# Patient Record
Sex: Male | Born: 1945 | Race: White | Hispanic: No | Smoking: Never smoker
Health system: Southern US, Community
[De-identification: ages and names within clinical notes are randomized; demographics above are authoritative.]

## PROBLEM LIST (undated history)

## (undated) DIAGNOSIS — D649 Anemia, unspecified: Secondary | ICD-10-CM

## (undated) DIAGNOSIS — F419 Anxiety disorder, unspecified: Secondary | ICD-10-CM

## (undated) DIAGNOSIS — D696 Thrombocytopenia, unspecified: Secondary | ICD-10-CM

## (undated) DIAGNOSIS — D699 Hemorrhagic condition, unspecified: Secondary | ICD-10-CM

## (undated) DIAGNOSIS — I493 Ventricular premature depolarization: Secondary | ICD-10-CM

## (undated) DIAGNOSIS — I499 Cardiac arrhythmia, unspecified: Secondary | ICD-10-CM

## (undated) DIAGNOSIS — I10 Essential (primary) hypertension: Secondary | ICD-10-CM

## (undated) DIAGNOSIS — D369 Benign neoplasm, unspecified site: Secondary | ICD-10-CM

## (undated) DIAGNOSIS — R972 Elevated prostate specific antigen [PSA]: Secondary | ICD-10-CM

## (undated) DIAGNOSIS — Z8679 Personal history of other diseases of the circulatory system: Secondary | ICD-10-CM

## (undated) HISTORY — DX: Essential (primary) hypertension: I10

## (undated) HISTORY — DX: Hemorrhagic condition, unspecified: D69.9

## (undated) HISTORY — DX: Anxiety disorder, unspecified: F41.9

## (undated) HISTORY — PX: OTHER SURGICAL HISTORY: SHX169

## (undated) HISTORY — PX: TONSILLECTOMY: SUR1361

## (undated) HISTORY — PX: CARDIAC VALVE REPLACEMENT: SHX585

---

## 2004-12-07 ENCOUNTER — Emergency Department: Payer: Self-pay | Admitting: Emergency Medicine

## 2004-12-07 ENCOUNTER — Other Ambulatory Visit: Payer: Self-pay

## 2007-06-13 ENCOUNTER — Ambulatory Visit: Payer: Self-pay | Admitting: Unknown Physician Specialty

## 2012-06-29 ENCOUNTER — Ambulatory Visit: Payer: Self-pay | Admitting: Unknown Physician Specialty

## 2012-07-02 LAB — PATHOLOGY REPORT

## 2013-08-27 DIAGNOSIS — I493 Ventricular premature depolarization: Secondary | ICD-10-CM | POA: Insufficient documentation

## 2013-08-27 DIAGNOSIS — Q231 Congenital insufficiency of aortic valve: Secondary | ICD-10-CM | POA: Insufficient documentation

## 2014-02-03 DIAGNOSIS — I10 Essential (primary) hypertension: Secondary | ICD-10-CM | POA: Insufficient documentation

## 2014-11-04 ENCOUNTER — Ambulatory Visit: Payer: Self-pay | Admitting: Urology

## 2014-11-11 ENCOUNTER — Encounter: Payer: Self-pay | Admitting: Urology

## 2014-11-11 ENCOUNTER — Ambulatory Visit (INDEPENDENT_AMBULATORY_CARE_PROVIDER_SITE_OTHER): Payer: Commercial Managed Care - HMO | Admitting: Urology

## 2014-11-11 VITALS — BP 116/71 | HR 57 | Ht 77.0 in | Wt 217.4 lb

## 2014-11-11 DIAGNOSIS — R361 Hematospermia: Secondary | ICD-10-CM

## 2014-11-11 DIAGNOSIS — N486 Induration penis plastica: Secondary | ICD-10-CM

## 2014-11-11 LAB — URINALYSIS, COMPLETE
BILIRUBIN UA: NEGATIVE
Glucose, UA: NEGATIVE
KETONES UA: NEGATIVE
Leukocytes, UA: NEGATIVE
NITRITE UA: NEGATIVE
PROTEIN UA: NEGATIVE
RBC, UA: NEGATIVE
Specific Gravity, UA: 1.02 (ref 1.005–1.030)
Urobilinogen, Ur: 0.2 mg/dL (ref 0.2–1.0)
pH, UA: 5.5 (ref 5.0–7.5)

## 2014-11-11 LAB — MICROSCOPIC EXAMINATION
Bacteria, UA: NONE SEEN
EPITHELIAL CELLS (NON RENAL): NONE SEEN /HPF (ref 0–10)
RBC MICROSCOPIC, UA: NONE SEEN /HPF (ref 0–?)

## 2014-11-11 NOTE — Patient Instructions (Signed)
Cystoscopy       Cystoscopy is a procedure that is used to help your caregiver diagnose and sometimes treat conditions that affect your lower urinary tract. Your lower urinary tract includes your bladder and the tube through which urine passes from your bladder out of your body (urethra). Cystoscopy is performed with a thin, tube-shaped instrument (cystoscope). The cystoscope has lenses and a light at the end so that your caregiver can see inside your bladder. The cystoscope is inserted at the entrance of your urethra. Your caregiver guides it through your urethra and into your bladder. There are two main types of cystoscopy:   Flexible cystoscopy (with a flexible cystoscope).   Rigid cystoscopy (with a rigid cystoscope).  Cystoscopy may be recommended for many conditions, including:   Urinary tract infections.   Blood in your urine (hematuria).   Loss of bladder control (urinary incontinence) or overactive bladder.   Unusual cells found in a urine sample.   Urinary blockage.   Painful urination.  Cystoscopy may also be done to remove a sample of your tissue to be checked under a microscope (biopsy). It may also be done to remove or destroy bladder stones.   LET YOUR CAREGIVER KNOW ABOUT:   Allergies to food or medicine.   Medicines taken, including vitamins, herbs, eyedrops, over-the-counter medicines, and creams.   Use of steroids (by mouth or creams).   Previous problems with anesthetics or numbing medicines.   History of bleeding problems or blood clots.   Previous surgery.   Other health problems, including diabetes and kidney problems.   Possibility of pregnancy, if this applies.  PROCEDURE   The area around the opening to your urethra will be cleaned. A medicine to numb your urethra (local anesthetic) is used. If a tissue sample or stone is removed during the procedure, you may be given a medicine to make you sleep (general anesthetic).   Your caregiver will gently insert the tip of the cystoscope into your  urethra. The cystoscope will be slowly glided through your urethra and into your bladder. Sterile fluid will flow through the cystoscope and into your bladder. The fluid will expand and stretch your bladder. This gives your caregiver a better view of your bladder walls. The procedure lasts about 15-20 minutes.   AFTER THE PROCEDURE   If a local anesthetic is used, you will be allowed to go home as soon as you are ready. If a general anesthetic is used, you will be taken to a recovery area until you are stable. You may have temporary bleeding and burning on urination.   Document Released: 04/01/2000 Document Revised: 12/28/2011 Document Reviewed: 09/26/2011   ExitCare® Patient Information ©2015 [employer]. This information is not intended to replace advice given to you by your health care provider. Make sure you discuss any questions you have with your health care provider.

## 2014-11-11 NOTE — Progress Notes (Signed)
11/11/2014 11:43 AM   Dominic Osborn Oct 15, 1945 [id number]  Referring provider: Rusty Aus, MD [address] [place], [place] [address]  Chief Complaint  Patient presents with  . Hematospermia    New Patient    HPI: 69 yo M with 6 months of hematospermia referred by Dr. [name].  The suspending present with each ejaculation of varying quality ranging from light pink to dark red persistently over the past 6 months.  He ejaculates of the frequency of 1-2 times per week.  No pain with intercourse or pain with intercourse.  No history of trauma during sexual activity or masturbation.  No erectile dysfunction, able achieve and maintain an erection adequate for penetration without difficulty.    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  This has been present and stable for years. He denies any associated pain or weakness of his erections. No history of penile trauma.  Normal PSA and annual rectal exam by Dr. [name].    No gross hematuria.  No dysuria, No UTIs.  He does get up once nightly to void.    No history of kidney stones.    No currently on any blood thinners.  He does not that his father was a "free bleeder" but does report that he does have some easy bleeding tendencies.  He does not taken any ASA but does take naproxen twice daily for arthritis.  He has stopped taking this one week ago to see if this helps.    Never smoker.     PMH: Past Medical History  Diagnosis Date  . Anxiety   . Bleeding disorder   . Hypertension     Surgical History: Past Surgical History  Procedure Laterality Date  . Tonsillectomy      Home Medications:    Medication List       This list is accurate as of: 11/11/14 11:43 AM.  Always use your most recent med list.               amLODipine 5 MG tablet  Commonly known as:  NORVASC  TAKE 1 TABLET ONE TIME DAILY     atenolol 25 MG tablet    Commonly known as:  TENORMIN  Take by mouth.     Calcium-Vitamin D 600-200 MG-UNIT per tablet  Take by mouth.     cetirizine 10 MG tablet  Commonly known as:  ZYRTEC  Take by mouth.     D 2000 2000 UNITS Tabs  Generic drug:  Cholecalciferol  Take by mouth.     Magnesium 300 MG Caps  Take by mouth.     metoprolol succinate 50 MG 24 hr tablet  Commonly known as:  TOPROL-XL  TAKE 1 TABLET EVERY DAY     MULTI-VITAMINS Tabs  Take by mouth.     RA NAPROXEN SODIUM 220 MG tablet  Generic drug:  naproxen sodium  Take by mouth.        Allergies: No Known Allergies  Family History: Family History  Problem Relation Age of Onset  . Bladder Cancer Neg Hx   . Prostate cancer Neg Hx   . Kidney cancer Neg Hx     Social History:  reports that he has never smoked. He does not have any smokeless tobacco history on file. He reports that he drinks alcohol. He reports that he does not use illicit drugs.  ROS: UROLOGY Frequent Urination?: No Hard to postpone urination?: No Burning/pain with urination?: No Get up at night to urinate?: Yes Leakage of urine?: No Urine stream starts and stops?: No Trouble starting stream?: No Do you have to strain to urinate?: No Blood in urine?: No Urinary tract infection?: No Sexually transmitted disease?: No Injury to kidneys or bladder?: No Painful intercourse?: No Weak stream?: No Erection problems?: No Penile pain?: No  Gastrointestinal Nausea?: No Vomiting?: No Indigestion/heartburn?: No Diarrhea?: No Constipation?: No  Constitutional Fever: No Night sweats?: No Weight loss?: No Fatigue?: No  Skin Skin rash/lesions?: No Itching?: No  Eyes Blurred vision?: No Double vision?: No  Ears/Nose/Throat Sore throat?: No Sinus problems?: No  Hematologic/Lymphatic Swollen glands?: No Easy bruising?: No  Cardiovascular Leg swelling?: No Chest pain?: No  Respiratory Cough?: No Shortness of breath?:  No  Endocrine Excessive thirst?: No  Musculoskeletal Back pain?: No Joint pain?: No  Neurological Headaches?: No Dizziness?: No  Psychologic Depression?: No Anxiety?: No  Physical Exam: BP 116/71 mmHg  Pulse 57  Ht 6\' 5"  (1.956 m)  Wt 217 lb 6.4 oz (98.612 kg)  BMI 25.77 kg/m2  Constitutional:  Alert and oriented, No acute distress. HEENT: [place] AT, moist mucus membranes.  Trachea midline, no masses. Cardiovascular: No clubbing, cyanosis, or edema. Respiratory: Normal respiratory effort, no increased work of breathing. GI: Abdomen is soft, nontender, nondistended, no abdominal masses GU: No CVA tenderness. 30 cc prostate, rubbery lateral lobes, no masses or tenderness.  Normal scrotum with bilateral descended t.   Skin: No rashes, bruises or suspicious lesions. Lymph: No cervical or inguinal adenopathy. Neurologic: Grossly intact, no focal deficits, moving all 4 extremities. Psychiatric: Normal mood and affect.  Laboratory Data: PSA, Total (Screen) - Final result (07/25/2014 8:54 AM) PSA, Total (Screen) - Final result (07/25/2014 8:54 AM)  Component Value Range  PSA (Prostate Specific Antigen), Total 2.70 0.10-4.00 ng/mL    Comprehensive Metabolic Panel (CMP) - Final result (07/25/2014 8:54 AM) Comprehensive Metabolic Panel (CMP) - Final result (07/25/2014 8:54 AM)  Component Value Range  Glucose 91 70-110 mg/dL  Sodium 139 136-145 mmol/L  Potassium 4.1 3.6-5.1 mmol/L  Chloride 106 97-109 mmol/L  Carbon Dioxide (CO2) 30.6 22.0-32.0 mmol/L  Urea Nitrogen (BUN) 20 7-25 mg/dL  Creatinine 1.0 0.7-1.3 mg/dL   CBC w/auto Differential (5 Part) - Final result (07/25/2014 8:54 AM) CBC w/auto Differential (5 Part) - Final result (07/25/2014 8:54 AM)  Component Value Range  WBC (White Blood Cell Count) 4.5 4.1-10.2 10^3/uL  RBC (Red Blood Cell Count) 4.91 4.69-6.13 10^6/uL  Hemoglobin 14.8 14.1-18.1 gm/dL  Hematocrit 44.0 40.0-52.0 %  MCV (Mean Corpuscular Volume) 89.6  80.0-100.0 fl  MCH (Mean Corpuscular Hemoglobin) 30.1 27.0-31.2 pg  MCHC (Mean Corpuscular Hemoglobin Concentration) 33.6 32.0-36.0 gm/dL  Platelet Count 132 (L) 150-450 10^3/uL     Urinalysis Results for orders placed or performed in visit on 11/11/14  Microscopic Examination  Result Value Ref Range   WBC, UA 0-5 0 -  5 /hpf   RBC, UA None seen 0 -  2 /hpf   Epithelial Cells (non renal) None seen 0 - 10 /hpf   Bacteria, UA None seen None seen/Few  Urinalysis, Complete  Result Value Ref Range   Specific Gravity, UA 1.020 1.005 - 1.030   pH, UA 5.5 5.0 - 7.5   Color, UA Yellow Yellow   Appearance Ur Clear Clear   Leukocytes, UA Negative Negative   Protein, UA Negative Negative/Trace   Glucose, UA Negative Negative  Ketones, UA Negative Negative   RBC, UA Negative Negative   Bilirubin, UA Negative Negative   Urobilinogen, Ur 0.2 0.2 - 1.0 mg/dL   Nitrite, UA Negative Negative   Microscopic Examination See below:     Pertinent Imaging: n/a  Assessment & Plan:  69 year old male with persistent amount aspermia 6 months and asymptomatic perone's disease.  1. Hematospermia We discussed the differential diagnosis for hematospermia with underlying etiologies typically benign. Rectal exam today was normal and normal PSA. No gross hematuria or microscopic blood in his urine.  He is very anxious about this finding and would like to proceed with cystoscopy for further workup. We discussed the procedure in detail. He will return in 4 weeks. 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 Stable plaque without pain. I would not recommend any further treatment. He was advised to let us know if he develops any pain, increasing curvature, or instability of erection.  Return in about 4 weeks (around 12/09/2014) for cystoscopy (any MD).  Dominic Espy, MD  [facility] [address],  [place],  [address] [phone]

## 2014-11-12 ENCOUNTER — Encounter: Payer: Self-pay | Admitting: Urology

## 2014-11-13 ENCOUNTER — Ambulatory Visit: Payer: Self-pay

## 2014-12-12 ENCOUNTER — Other Ambulatory Visit: Payer: Commercial Managed Care - HMO

## 2017-08-30 ENCOUNTER — Ambulatory Visit
Admission: RE | Admit: 2017-08-30 | Discharge: 2017-08-30 | Disposition: A | Discharge status: Home / Self Care | Payer: Medicare HMO | Source: Ambulatory Visit | Attending: Internal Medicine | Admitting: Internal Medicine

## 2017-08-30 ENCOUNTER — Other Ambulatory Visit: Payer: Self-pay | Admitting: Internal Medicine

## 2017-08-30 DIAGNOSIS — L928 Other granulomatous disorders of the skin and subcutaneous tissue: Secondary | ICD-10-CM | POA: Diagnosis not present

## 2017-08-30 DIAGNOSIS — R079 Chest pain, unspecified: Secondary | ICD-10-CM

## 2017-08-30 DIAGNOSIS — I712 Thoracic aortic aneurysm, without rupture, unspecified: Secondary | ICD-10-CM

## 2017-08-30 DIAGNOSIS — I700 Atherosclerosis of aorta: Secondary | ICD-10-CM | POA: Insufficient documentation

## 2017-08-30 DIAGNOSIS — I2510 Atherosclerotic heart disease of native coronary artery without angina pectoris: Secondary | ICD-10-CM | POA: Diagnosis not present

## 2017-08-30 MED ORDER — IOPAMIDOL (ISOVUE-370) INJECTION 76%
75.0000 mL | Freq: Once | INTRAVENOUS | Status: AC | PRN
  Administered 2017-08-30: 75 mL via INTRAVENOUS

## 2017-08-31 ENCOUNTER — Ambulatory Visit: Payer: Self-pay

## 2017-09-25 ENCOUNTER — Ambulatory Visit (INDEPENDENT_AMBULATORY_CARE_PROVIDER_SITE_OTHER): Payer: Medicare HMO | Admitting: Vascular Surgery

## 2017-09-25 ENCOUNTER — Encounter (INDEPENDENT_AMBULATORY_CARE_PROVIDER_SITE_OTHER): Payer: Self-pay | Admitting: Vascular Surgery

## 2017-09-25 VITALS — BP 116/68 | HR 70 | Resp 14 | Ht 76.0 in | Wt 226.0 lb

## 2017-09-25 DIAGNOSIS — I712 Thoracic aortic aneurysm, without rupture: Secondary | ICD-10-CM | POA: Diagnosis not present

## 2017-09-25 DIAGNOSIS — Q231 Congenital insufficiency of aortic valve: Secondary | ICD-10-CM | POA: Diagnosis not present

## 2017-09-25 DIAGNOSIS — I10 Essential (primary) hypertension: Secondary | ICD-10-CM

## 2017-09-25 DIAGNOSIS — I7121 Aneurysm of the ascending aorta, without rupture: Secondary | ICD-10-CM

## 2017-09-25 NOTE — Progress Notes (Signed)
MRN : [id number]  Dominic Osborn is a 72 y.o. (17-Feb-1946) male who presents with chief complaint of  Chief Complaint  Patient presents with  . New Patient (Initial Visit)    Thorasic aortic aneurysm  .  History of Present Illness:   The patient presents to the office for evaluation of an ascending aortic aneurysm. The aneurysm was found incidentally by echocardiogram for aortic valve stenosis. Patient denies chest pain or unusual back pain, no other thoracic complaints.  No history of an acute onset of painful blue discoloration of the toes or TIA symptoms.     No family history of TAA.   Patient denies amaurosis fugax or TIA symptoms. There is no history of claudication or rest pain symptoms of the lower extremities.  The patient denies angina or shortness of breath.  CT scan shows an TAA that measures 5.20 cm  Current Meds  Medication Sig  . amLODipine (NORVASC) 5 MG tablet TAKE 1 TABLET ONE TIME DAILY  . amoxicillin (AMOXIL) 500 MG tablet Take 500 mg by mouth 2 (two) times daily.  Marland Kitchen atenolol (TENORMIN) 25 MG tablet Take by mouth.   . Calcium Carbonate-Vitamin D (CALCIUM HIGH POTENCY/VITAMIN D) 600-200 MG-UNIT TABS Take by mouth.  . cetirizine (ZYRTEC) 10 MG tablet Take by mouth.  . Cholecalciferol (D 2000) 2000 UNITS TABS Take by mouth.  . fluticasone (FLONASE) 50 MCG/ACT nasal spray Place into the nose.  . Magnesium 300 MG CAPS Take by mouth.  . metoprolol succinate (TOPROL-XL) 50 MG 24 hr tablet TAKE 1 TABLET EVERY DAY  . Multiple Vitamin (MULTI-VITAMINS) TABS Take by mouth.  . naproxen sodium (RA NAPROXEN SODIUM) 220 MG tablet Take by mouth.    Past Medical History:  Diagnosis Date  . Anxiety   . Bleeding disorder ([place])   . Hypertension     Past Surgical History:  Procedure Laterality Date  . TONSILLECTOMY      Social History Social History   Tobacco Use  . Smoking status: Never Smoker  . Smokeless tobacco: Never Used  Substance Use Topics  . Alcohol use:  Yes    Alcohol/week: 0.0 oz    Comment: occasional  . Drug use: No    Family History Family History  Problem Relation Age of Onset  . Bladder Cancer Neg Hx   . Prostate cancer Neg Hx   . Kidney cancer Neg Hx   No family history of bleeding/clotting disorders, porphyria or autoimmune disease   No Known Allergies   REVIEW OF SYSTEMS (Negative unless checked)  Constitutional: [] Weight loss  [] Fever  [] Chills Cardiac: [] Chest pain   [] Chest pressure   [] Palpitations   [] Shortness of breath when laying flat   [] Shortness of breath with exertion. Vascular:  [] Pain in legs with walking   [] Pain in legs at rest  [] History of DVT   [] Phlebitis   [] Swelling in legs   [] Varicose veins   [] Non-healing ulcers Pulmonary:   [] Uses home oxygen   [] Productive cough   [] Hemoptysis   [] Wheeze  [] COPD   [] Asthma Neurologic:  [] Dizziness   [] Seizures   [] History of stroke   [] History of TIA  [] Aphasia   [] Vissual changes   [] Weakness or numbness in arm   [] Weakness or numbness in leg Musculoskeletal:   [] Joint swelling   [] Joint pain   [] Low back pain Hematologic:  [] Easy bruising  [] Easy bleeding   [] Hypercoagulable state   [] Anemic Gastrointestinal:  [] Diarrhea   [] Vomiting  [] Gastroesophageal reflux/heartburn   []   Difficulty swallowing. Genitourinary:  [] Chronic kidney disease   [] Difficult urination  [] Frequent urination   [] Blood in urine Skin:  [] Rashes   [] Ulcers  Psychological:  [] History of anxiety   []  History of major depression.  Physical Examination  Vitals:   09/25/17 1558  BP: 116/68  Pulse: 70  Resp: 14  Weight: 226 lb (102.5 kg)  Height: 6\' 4"  (1.93 m)   Body mass index is 27.51 kg/m. Gen: WD/WN, NAD Head: [place]/AT, No temporalis wasting.  Ear/Nose/Throat: Hearing grossly intact, nares w/o erythema or drainage, poor dentition Eyes: PER, EOMI, sclera nonicteric.  Neck: Supple, no masses.  No bruit or JVD.  Pulmonary:  Good air movement, clear to auscultation bilaterally, no  use of accessory muscles.  Cardiac: RRR, normal S1, S2, + SE Murmurs. Vascular:  Soft carotid bruit but likely radiation from heart murmur Vessel Right Left  Radial Palpable Palpable  Ulnar Palpable Palpable  Brachial Palpable Palpable  Carotid Palpable Palpable  PT Palpable Palpable  DP Palpable Palpable   Gastrointestinal: soft, non-distended. No guarding/no peritoneal signs.  Musculoskeletal: M/S 5/5 throughout.  No deformity or atrophy.  Neurologic: CN 2-12 intact. Pain and light touch intact in extremities.  Symmetrical.  Speech is fluent. Motor exam as listed above. Psychiatric: Judgment intact, Mood & affect appropriate for pt's clinical situation. Dermatologic: No rashes or ulcers noted.  No changes consistent with cellulitis. Lymph : No Cervical lymphadenopathy, no lichenification or skin changes of chronic lymphedema.  CBC No results found for: WBC, HGB, HCT, MCV, PLT  BMET No results found for: NA, K, CL, CO2, GLUCOSE, BUN, CREATININE, CALCIUM, GFRNONAA, GFRAA CrCl cannot be calculated (No order found.).  COAG No results found for: INR, PROTIME  Radiology Ct Angio Chest Aorta W/cm &/or Wo/cm  Result Date: 08/30/2017 CLINICAL DATA:  Chest pain EXAM: CT ANGIOGRAPHY CHEST WITH CONTRAST TECHNIQUE: Initially, axial CT images were obtained through the chest without intravenous contrast material administration. Multidetector CT imaging of the chest was performed using the standard protocol during bolus administration of intravenous contrast. Multiplanar CT image reconstructions and MIPs were obtained to evaluate the vascular anatomy. CONTRAST:  63mL ISOVUE-370 IOPAMIDOL (ISOVUE-370) INJECTION 76% COMPARISON:  Chest radiograph December 07, 2004 FINDINGS: Cardiovascular: The ascending thoracic aorta measures 5.1 x 5.1 cm in transverse diameter. No dissection is appreciable on this study. No intramural hematoma is evident on the noncontrast enhanced portion of the study. The visualized  great vessels appear unremarkable. No aneurysm or dissection involving these vessels is evident on this study. There is aortic atherosclerosis as well as foci of coronary artery calcification. There is no pericardial effusion or pericardial thickening. There is no major vessel pulmonary embolus appreciable. Mediastinum/Nodes: Thyroid appears unremarkable. There is no appreciable thoracic adenopathy. No esophageal lesions are appreciable. Lungs/Pleura: There is no parenchymal lung edema or consolidation. No pleural effusion or pleural thickening is evident. There is slight scarring in the anterior left base. There is a small calcified granuloma in the posterior segment of the left upper lobe. There is a tiny calcified granuloma in the anterior segment right upper lobe. There is a tiny calcified granuloma in the superior lingula peripherally. No noncalcified pulmonary nodular lesions are demonstrable. Upper Abdomen: There is a cyst in the right lobe of the liver near the junctions of the anterior and posterior segments measuring 2.5 x 2.4 cm. Visualized upper abdominal structures otherwise appear unremarkable. Musculoskeletal: There is degenerative change in the thoracic spine with diffuse idiopathic skeletal hyperostosis. No blastic or lytic bone  lesions evident. No chest wall lesions. Review of the MIP images confirms the above findings. IMPRESSION: 1. Ascending thoracic aortic aneurysm with maximum transverse diameter of 5.1 x 5.1 cm. No aneurysm seen in the descending thoracic aorta. No evident dissection. No evident major vessel pulmonary embolus. Visualized great vessels appear unremarkable. There are foci of aortic atherosclerosis as well as foci of coronary calcification. No pulmonary embolus evident. Ascending thoracic aortic aneurysm. Recommend semi-annual imaging followup by CTA or MRA and referral to cardiothoracic surgery if not already obtained. This recommendation follows 2010  ACCF/AHA/AATS/ACR/ASA/SCA/SCAI/SIR/STS/SVM Guidelines for the Diagnosis and Management of Patients With Thoracic Aortic Disease. Circulation. 2010; 121: G295-M841. 2. Scattered small calcified granulomas. No edema or consolidation. 3.  No evident thoracic adenopathy. Aortic aneurysm NOS (ICD10-I71.9). Aortic Atherosclerosis (ICD10-I70.0). Electronically Signed   By: Lowella Grip III M.D.   On: 08/30/2017 11:24     Assessment/Plan 1. Ascending aortic aneurysm ([place]) I will refer to [facility] for surgical evaluation   A total of 70 minutes was spent with this patient and greater than 50% was spent in counseling and coordination of care with the patient.  Discussion included the treatment options for vascular disease including indications for surgery and intervention.  Also discussed is the appropriate timing of treatment.  In addition medical therapy was discussed.  - Ambulatory referral to Cardiovascular Surgery  2. Aortic valve, bicuspid Defer to cardiothoracic surgery for workup  3. Essential (primary) hypertension Continue antihypertensive medications as already ordered, these medications have been reviewed and there are no changes at this time.     Hortencia Pilar, MD  09/25/2017 5:58 PM

## 2017-09-26 ENCOUNTER — Encounter (INDEPENDENT_AMBULATORY_CARE_PROVIDER_SITE_OTHER): Payer: Self-pay | Admitting: Vascular Surgery

## 2017-09-29 ENCOUNTER — Telehealth (INDEPENDENT_AMBULATORY_CARE_PROVIDER_SITE_OTHER): Payer: Self-pay | Admitting: Vascular Surgery

## 2017-11-01 ENCOUNTER — Ambulatory Visit: Admit: 2017-11-01 | Payer: Self-pay | Admitting: Unknown Physician Specialty

## 2017-11-01 SURGERY — COLONOSCOPY WITH PROPOFOL
Anesthesia: General

## 2018-03-23 DIAGNOSIS — Z9889 Other specified postprocedural states: Secondary | ICD-10-CM | POA: Insufficient documentation

## 2018-03-23 DIAGNOSIS — Z952 Presence of prosthetic heart valve: Secondary | ICD-10-CM | POA: Insufficient documentation

## 2018-04-06 DIAGNOSIS — Z8679 Personal history of other diseases of the circulatory system: Secondary | ICD-10-CM | POA: Insufficient documentation

## 2018-05-17 ENCOUNTER — Encounter: Payer: Self-pay | Admitting: *Deleted

## 2018-05-17 ENCOUNTER — Encounter: Payer: Medicare HMO | Attending: Internal Medicine | Admitting: *Deleted

## 2018-05-17 VITALS — Ht 75.75 in | Wt 215.5 lb

## 2018-05-17 DIAGNOSIS — I10 Essential (primary) hypertension: Secondary | ICD-10-CM | POA: Insufficient documentation

## 2018-05-17 DIAGNOSIS — Z79899 Other long term (current) drug therapy: Secondary | ICD-10-CM | POA: Insufficient documentation

## 2018-05-17 DIAGNOSIS — F419 Anxiety disorder, unspecified: Secondary | ICD-10-CM | POA: Diagnosis not present

## 2018-05-17 DIAGNOSIS — Z952 Presence of prosthetic heart valve: Secondary | ICD-10-CM | POA: Diagnosis present

## 2018-05-17 NOTE — Progress Notes (Signed)
Daily Session Note  Patient Details  Name: Dominic Osborn MRN: [id number] Date of Birth: Jul 17, 1945 Referring Provider:     Cardiac Rehab from 05/17/2018 in [facility]  Referring Provider  [name]      Encounter Date: 05/17/2018  Check In: Session Check In - 05/17/18 1351      Check-In   Supervising physician immediately available to respond to emergencies  See telemetry face sheet for immediately available ER MD    Location  [facility]    Staff Present  Renita Papa, RN Vickki Hearing, BA, ACSM CEP, Exercise Physiologist    Warm-up and Cool-down  Not performed (comment)   Med Review completed   Resistance Training Performed  Yes    VAD Patient?  No    PAD/SET Patient?  No      Pain Assessment   Currently in Pain?  No/denies        Exercise Prescription Changes - 05/17/18 1400      Response to Exercise   Blood Pressure (Admit)  142/56    Blood Pressure (Exercise)  218/70    Blood Pressure (Exit)  178/68    Heart Rate (Admit)  64 bpm    Heart Rate (Exercise)  99 bpm    Heart Rate (Exit)  81 bpm    Oxygen Saturation (Admit)  98 %       Social History   Tobacco Use  Smoking Status Never Smoker  Smokeless Tobacco Never Used    Goals Met:  Exercise tolerated well Personal goals reviewed No report of cardiac concerns or symptoms Strength training completed today  Goals Unmet:  Not Applicable  Comments: Med Review completed   Dr. Emily Filbert is Medical Director for [facility].

## 2018-05-17 NOTE — Patient Instructions (Signed)
Patient Instructions  Patient Details  Name: Dominic Osborn MRN: [id number] Date of Birth: [birth year] Referring Provider:  Rusty Aus, MD  Below are your personal goals for exercise, nutrition, and risk factors. Our goal is to help you stay on track towards obtaining and maintaining these goals. We will be discussing your progress on these goals with you throughout the program.  Initial Exercise Prescription: Initial Exercise Prescription - 05/17/18 1400      Date of Initial Exercise RX and Referring Provider   Date  05/17/18    Referring Provider  [name]      Treadmill   MPH  3.6    Grade  1.5    Minutes  15    METs  4.5      Recumbant Bike   Level  8    RPM  60    Watts  85    Minutes  15    METs  4.5      Arm Ergometer   Level  3    RPM  40    Minutes  15    METs  4.5      Prescription Details   Frequency (times per week)  3    Duration  Progress to 30 minutes of continuous aerobic without signs/symptoms of physical distress      Intensity   THRR 40-80% of Max Heartrate  102-133    Ratings of Perceived Exertion  11-15    Perceived Dyspnea  0-4      Resistance Training   Training Prescription  Yes    Weight  4    Reps  10-15       Exercise Goals: Frequency: Be able to perform aerobic exercise two to three times per week in program working toward 2-5 days per week of home exercise.  Intensity: Work with a perceived exertion of 11 (fairly light) - 15 (hard) while following your exercise prescription.  We will make changes to your prescription with you as you progress through the program.   Duration: Be able to do 30 to 45 minutes of continuous aerobic exercise in addition to a 5 minute warm-up and a 5 minute cool-down routine.   Nutrition Goals: Your personal nutrition goals will be established when you do your nutrition analysis with the dietician.  The following are general nutrition guidelines to follow: Cholesterol < 200mg /day Sodium <  1500mg /day Fiber: Men over 50 yrs - 30 grams per day  Personal Goals: Personal Goals and Risk Factors at Admission - 05/17/18 1409      Core Components/Risk Factors/Patient Goals on Admission    Weight Management  Yes;Weight Maintenance    Intervention  Weight Management: Develop a combined nutrition and exercise program designed to reach desired caloric intake, while maintaining appropriate intake of nutrient and fiber, sodium and fats, and appropriate energy expenditure required for the weight goal.;Weight Management: Provide education and appropriate resources to help participant work on and attain dietary goals.    Admit Weight  215 lb 8 oz (97.8 kg)    Expected Outcomes  Short Term: Continue to assess and modify interventions until short term weight is achieved;Long Term: Adherence to nutrition and physical activity/exercise program aimed toward attainment of established weight goal;Weight Maintenance: Understanding of the daily nutrition guidelines, which includes 25-35% calories from fat, 7% or less cal from saturated fats, less than 200mg  cholesterol, less than 1.5gm of sodium, & 5 or more servings of fruits and vegetables daily;Understanding recommendations for meals to  include 15-35% energy as protein, 25-35% energy from fat, 35-60% energy from carbohydrates, less than 200mg  of dietary cholesterol, 20-35 gm of total fiber daily;Understanding of distribution of calorie intake throughout the day with the consumption of 4-5 meals/snacks    Hypertension  Yes    Intervention  Provide education on lifestyle modifcations including regular physical activity/exercise, weight management, moderate sodium restriction and increased consumption of fresh fruit, vegetables, and low fat dairy, alcohol moderation, and smoking cessation.;Monitor prescription use compliance.    Expected Outcomes  Short Term: Continued assessment and intervention until BP is < 140/5mm HG in hypertensive participants. < 130/59mm  HG in hypertensive participants with diabetes, heart failure or chronic kidney disease.;Long Term: Maintenance of blood pressure at goal levels.       Tobacco Use Initial Evaluation: Social History   Tobacco Use  Smoking Status Never Smoker  Smokeless Tobacco Never Used    Exercise Goals and Review: Exercise Goals    Row Name 05/17/18 1444             Exercise Goals   Increase Physical Activity  Yes       Intervention  Provide advice, education, support and counseling about physical activity/exercise needs.;Develop an individualized exercise prescription for aerobic and resistive training based on initial evaluation findings, risk stratification, comorbidities and participant's personal goals.       Expected Outcomes  Short Term: Attend rehab on a regular basis to increase amount of physical activity.;Long Term: Add in home exercise to make exercise part of routine and to increase amount of physical activity.;Long Term: Exercising regularly at least 3-5 days a week.       Increase Strength and Stamina  Yes       Intervention  Provide advice, education, support and counseling about physical activity/exercise needs.;Develop an individualized exercise prescription for aerobic and resistive training based on initial evaluation findings, risk stratification, comorbidities and participant's personal goals.       Expected Outcomes  Short Term: Increase workloads from initial exercise prescription for resistance, speed, and METs.;Short Term: Perform resistance training exercises routinely during rehab and add in resistance training at home;Long Term: Improve cardiorespiratory fitness, muscular endurance and strength as measured by increased METs and functional capacity (6MWT)       Able to understand and use rate of perceived exertion (RPE) scale  Yes       Intervention  Provide education and explanation on how to use RPE scale       Expected Outcomes  Short Term: Able to use RPE daily in rehab  to express subjective intensity level;Long Term:  Able to use RPE to guide intensity level when exercising independently       Able to understand and use Dyspnea scale  Yes       Intervention  Provide education and explanation on how to use Dyspnea scale       Expected Outcomes  Short Term: Able to use Dyspnea scale daily in rehab to express subjective sense of shortness of breath during exertion;Long Term: Able to use Dyspnea scale to guide intensity level when exercising independently       Knowledge and understanding of Target Heart Rate Range (THRR)  Yes       Intervention  Provide education and explanation of THRR including how the numbers were predicted and where they are located for reference       Expected Outcomes  Short Term: Able to state/look up THRR;Short Term: Able to use daily as guideline  for intensity in rehab;Long Term: Able to use THRR to govern intensity when exercising independently       Able to check pulse independently  Yes       Intervention  Review the importance of being able to check your own pulse for safety during independent exercise;Provide education and demonstration on how to check pulse in carotid and radial arteries.       Expected Outcomes  Short Term: Able to explain why pulse checking is important during independent exercise;Long Term: Able to check pulse independently and accurately       Understanding of Exercise Prescription  Yes       Intervention  Provide education, explanation, and written materials on patient's individual exercise prescription       Expected Outcomes  Short Term: Able to explain program exercise prescription;Long Term: Able to explain home exercise prescription to exercise independently          Copy of goals given to participant.

## 2018-05-17 NOTE — Progress Notes (Signed)
Cardiac Individual Treatment Plan  Patient Details  Name: Dominic Osborn MRN: [id number] Date of Birth: July 03, 1945 Referring Provider:     Cardiac Rehab from 05/17/2018 in [facility] Cardiac and Pulmonary Rehab  Referring Provider  [name]      Initial Encounter Date:    Cardiac Rehab from 05/17/2018 in [facility] Cardiac and Pulmonary Rehab  Date  05/17/18      Visit Diagnosis: S/P AVR (aortic valve replacement)  Patient's Home Medications on Admission:  Current Outpatient Medications:  .  amiodarone (PACERONE) 200 MG tablet, , Disp: , Rfl:  .  amoxicillin (AMOXIL) 500 MG tablet, Take 500 mg by mouth 2 (two) times daily., Disp: , Rfl:  .  Cholecalciferol (D 2000) 2000 UNITS TABS, Take by mouth., Disp: , Rfl:  .  fluticasone (FLONASE) 50 MCG/ACT nasal spray, Place into the nose., Disp: , Rfl:  .  furosemide (LASIX) 40 MG tablet, TK 1 T PO ONCE D, Disp: , Rfl:  .  metoprolol succinate (TOPROL-XL) 50 MG 24 hr tablet, TAKE 1 TABLET EVERY DAY, Disp: , Rfl:  .  Multiple Vitamin (MULTI-VITAMINS) TABS, Take by mouth., Disp: , Rfl:  .  potassium chloride SA (K-DUR,KLOR-CON) 20 MEQ tablet, TK 1 T PO ONCE D. TK WHILE ON LASIX, Disp: , Rfl:  .  traZODone (DESYREL) 50 MG tablet, , Disp: , Rfl:  .  amLODipine (NORVASC) 5 MG tablet, TAKE 1 TABLET ONE TIME DAILY, Disp: , Rfl:  .  atenolol (TENORMIN) 25 MG tablet, Take by mouth. , Disp: , Rfl:  .  Calcium Carbonate-Vitamin D (CALCIUM HIGH POTENCY/VITAMIN D) 600-200 MG-UNIT TABS, Take by mouth., Disp: , Rfl:  .  cetirizine (ZYRTEC) 10 MG tablet, Take by mouth., Disp: , Rfl:  .  Magnesium 300 MG CAPS, Take by mouth., Disp: , Rfl:  .  naproxen sodium (RA NAPROXEN SODIUM) 220 MG tablet, Take by mouth., Disp: , Rfl:   Past Medical History: Past Medical History:  Diagnosis Date  . Anxiety   . Bleeding disorder ([place])   . Hypertension     Tobacco Use: Social History   Tobacco Use  Smoking Status Never Smoker  Smokeless Tobacco Never Used    Labs: Recent  Review Flowsheet Data    There is no flowsheet data to display.       Exercise Target Goals: Exercise Program Goal: Individual exercise prescription set using results from initial 6 min walk test and THRR while considering  patient's activity barriers and safety.   Exercise Prescription Goal: Initial exercise prescription builds to 30-45 minutes a day of aerobic activity, 2-3 days per week.  Home exercise guidelines will be given to patient during program as part of exercise prescription that the participant will acknowledge.  Activity Barriers & Risk Stratification: Activity Barriers & Cardiac Risk Stratification - 05/17/18 1435      Activity Barriers & Cardiac Risk Stratification   Activity Barriers  None    Cardiac Risk Stratification  High       6 Minute Walk: 6 Minute Walk    Row Name 05/17/18 1445         6 Minute Walk   Phase  Initial     Distance  1900 feet     Walk Time  6 minutes     # of Rest Breaks  0     MPH  3.6     METS  4.72     RPE  13     Perceived Dyspnea  0     VO2 Peak  16.5     Symptoms  No     Resting HR  70 bpm     Resting BP  142/56     Resting Oxygen Saturation   98 %     Max Ex. HR  99 bpm     Max Ex. BP  218/70     2 Minute Post BP  178/68        Oxygen Initial Assessment:   Oxygen Re-Evaluation:   Oxygen Discharge (Final Oxygen Re-Evaluation):   Initial Exercise Prescription: Initial Exercise Prescription - 05/17/18 1400      Date of Initial Exercise RX and Referring Provider   Date  05/17/18    Referring Provider  [name]      Treadmill   MPH  3.6    Grade  1.5    Minutes  15    METs  4.5      Recumbant Bike   Level  8    RPM  60    Watts  85    Minutes  15    METs  4.5      Arm Ergometer   Level  3    RPM  40    Minutes  15    METs  4.5      Prescription Details   Frequency (times per week)  3    Duration  Progress to 30 minutes of continuous aerobic without signs/symptoms of physical distress       Intensity   THRR 40-80% of Max Heartrate  102-133    Ratings of Perceived Exertion  11-15    Perceived Dyspnea  0-4      Resistance Training   Training Prescription  Yes    Weight  4    Reps  10-15       Perform Capillary Blood Glucose checks as needed.  Exercise Prescription Changes: Exercise Prescription Changes    Row Name 05/17/18 1400             Response to Exercise   Blood Pressure (Admit)  142/56       Blood Pressure (Exercise)  218/70       Blood Pressure (Exit)  178/68       Heart Rate (Admit)  64 bpm       Heart Rate (Exercise)  99 bpm       Heart Rate (Exit)  81 bpm       Oxygen Saturation (Admit)  98 %          Exercise Comments:   Exercise Goals and Review: Exercise Goals    Row Name 05/17/18 1444             Exercise Goals   Increase Physical Activity  Yes       Intervention  Provide advice, education, support and counseling about physical activity/exercise needs.;Develop an individualized exercise prescription for aerobic and resistive training based on initial evaluation findings, risk stratification, comorbidities and participant's personal goals.       Expected Outcomes  Short Term: Attend rehab on a regular basis to increase amount of physical activity.;Long Term: Add in home exercise to make exercise part of routine and to increase amount of physical activity.;Long Term: Exercising regularly at least 3-5 days a week.       Increase Strength and Stamina  Yes       Intervention  Provide advice, education, support and counseling about physical activity/exercise needs.;Develop an  individualized exercise prescription for aerobic and resistive training based on initial evaluation findings, risk stratification, comorbidities and participant's personal goals.       Expected Outcomes  Short Term: Increase workloads from initial exercise prescription for resistance, speed, and METs.;Short Term: Perform resistance training exercises routinely during rehab  and add in resistance training at home;Long Term: Improve cardiorespiratory fitness, muscular endurance and strength as measured by increased METs and functional capacity (6MWT)       Able to understand and use rate of perceived exertion (RPE) scale  Yes       Intervention  Provide education and explanation on how to use RPE scale       Expected Outcomes  Short Term: Able to use RPE daily in rehab to express subjective intensity level;Long Term:  Able to use RPE to guide intensity level when exercising independently       Able to understand and use Dyspnea scale  Yes       Intervention  Provide education and explanation on how to use Dyspnea scale       Expected Outcomes  Short Term: Able to use Dyspnea scale daily in rehab to express subjective sense of shortness of breath during exertion;Long Term: Able to use Dyspnea scale to guide intensity level when exercising independently       Knowledge and understanding of Target Heart Rate Range (THRR)  Yes       Intervention  Provide education and explanation of THRR including how the numbers were predicted and where they are located for reference       Expected Outcomes  Short Term: Able to state/look up THRR;Short Term: Able to use daily as guideline for intensity in rehab;Long Term: Able to use THRR to govern intensity when exercising independently       Able to check pulse independently  Yes       Intervention  Review the importance of being able to check your own pulse for safety during independent exercise;Provide education and demonstration on how to check pulse in carotid and radial arteries.       Expected Outcomes  Short Term: Able to explain why pulse checking is important during independent exercise;Long Term: Able to check pulse independently and accurately       Understanding of Exercise Prescription  Yes       Intervention  Provide education, explanation, and written materials on patient's individual exercise prescription       Expected  Outcomes  Short Term: Able to explain program exercise prescription;Long Term: Able to explain home exercise prescription to exercise independently          Exercise Goals Re-Evaluation :   Discharge Exercise Prescription (Final Exercise Prescription Changes): Exercise Prescription Changes - 05/17/18 1400      Response to Exercise   Blood Pressure (Admit)  142/56    Blood Pressure (Exercise)  218/70    Blood Pressure (Exit)  178/68    Heart Rate (Admit)  64 bpm    Heart Rate (Exercise)  99 bpm    Heart Rate (Exit)  81 bpm    Oxygen Saturation (Admit)  98 %       Nutrition:  Target Goals: Understanding of nutrition guidelines, daily intake of sodium <1512m, cholesterol <2039m calories 30% from fat and 7% or less from saturated fats, daily to have 5 or more servings of fruits and vegetables.  Biometrics: Pre Biometrics - 05/17/18 1443      Pre Biometrics   Height  6' 3.75" (1.924 m)    Weight  215 lb 8 oz (97.8 kg)    Waist Circumference  39.5 inches    Hip Circumference  44 inches    Waist to Hip Ratio  0.9 %    BMI (Calculated)  26.41    Single Leg Stand  30 seconds        Nutrition Therapy Plan and Nutrition Goals: Nutrition Therapy & Goals - 05/17/18 1419      Intervention Plan   Intervention  Prescribe, educate and counsel regarding individualized specific dietary modifications aiming towards targeted core components such as weight, hypertension, lipid management, diabetes, heart failure and other comorbidities.;Nutrition handout(s) given to patient.    Expected Outcomes  Short Term Goal: Understand basic principles of dietary content, such as calories, fat, sodium, cholesterol and nutrients.;Short Term Goal: A plan has been developed with personal nutrition goals set during dietitian appointment.;Long Term Goal: Adherence to prescribed nutrition plan.       Nutrition Assessments: Nutrition Assessments - 05/17/18 1419      MEDFICTS Scores   Pre Score  74        Nutrition Goals Re-Evaluation:   Nutrition Goals Discharge (Final Nutrition Goals Re-Evaluation):   Psychosocial: Target Goals: Acknowledge presence or absence of significant depression and/or stress, maximize coping skills, provide positive support system. Participant is able to verbalize types and ability to use techniques and skills needed for reducing stress and depression.   Initial Review & Psychosocial Screening: Initial Psych Review & Screening - 05/17/18 1412      Initial Review   Current issues with  Current Sleep Concerns;Current Stress Concerns    Source of Stress Concerns  Family    Comments  Wife has a neurological disease ("cousin to MS") and COPD. She is also experiencing some memory impairment. He reports this being very difficult on him, as his wife's health is his priority. He did schedule this survery of his AV replacement around his yard duties around the house. He is very organized and enjoys staying physically active. His wife's health is hard for him because he doesn't know what all he can do to help her.       Family Dynamics   Good Support System?  Yes   spouse, family, friends     Barriers   Psychosocial barriers to participate in program  There are no identifiable barriers or psychosocial needs.;The patient should benefit from training in stress management and relaxation.      Screening Interventions   Interventions  Encouraged to exercise;Program counselor consult;To provide support and resources with identified psychosocial needs;Provide feedback about the scores to participant    Expected Outcomes  Short Term goal: Utilizing psychosocial counselor, staff and physician to assist with identification of specific Stressors or current issues interfering with healing process. Setting desired goal for each stressor or current issue identified.;Long Term Goal: Stressors or current issues are controlled or eliminated.;Short Term goal: Identification and review  with participant of any Quality of Life or Depression concerns found by scoring the questionnaire.;Long Term goal: The participant improves quality of Life and PHQ9 Scores as seen by post scores and/or verbalization of changes       Quality of Life Scores:  Quality of Life - 05/17/18 1415      Quality of Life   Select  Quality of Life      Quality of Life Scores   Health/Function Pre  27.37 %    Socioeconomic Pre  25.71 %  Psych/Spiritual Pre  28.93 %    Family Pre  27.6 %    GLOBAL Pre  27.38 %      Scores of 19 and below usually indicate a poorer quality of life in these areas.  A difference of  2-3 points is a clinically meaningful difference.  A difference of 2-3 points in the total score of the Quality of Life Index has been associated with significant improvement in overall quality of life, self-image, physical symptoms, and general health in studies assessing change in quality of life.  PHQ-9: Recent Review Flowsheet Data    Depression screen [facility] 2/9 05/17/2018   Decreased Interest 0   Down, Depressed, Hopeless 0   PHQ - 2 Score 0   Altered sleeping 0   Tired, decreased energy 0   Change in appetite 0   Feeling bad or failure about yourself  0   Trouble concentrating 0   Moving slowly or fidgety/restless 0   Suicidal thoughts 0   PHQ-9 Score 0     Interpretation of Total Score  Total Score Depression Severity:  1-4 = Minimal depression, 5-9 = Mild depression, 10-14 = Moderate depression, 15-19 = Moderately severe depression, 20-27 = Severe depression   Psychosocial Evaluation and Intervention:   Psychosocial Re-Evaluation:   Psychosocial Discharge (Final Psychosocial Re-Evaluation):   Vocational Rehabilitation: Provide vocational rehab assistance to qualifying candidates.   Vocational Rehab Evaluation & Intervention: Vocational Rehab - 05/17/18 1412      Initial Vocational Rehab Evaluation & Intervention   Assessment shows need for Vocational  Rehabilitation  No       Education: Education Goals: Education classes will be provided on a variety of topics geared toward better understanding of heart health and risk factor modification. Participant will state understanding/return demonstration of topics presented as noted by education test scores.  Learning Barriers/Preferences: Learning Barriers/Preferences - 05/17/18 1411      Learning Barriers/Preferences   Learning Barriers  None    Learning Preferences  None       Education Topics:  AED/CPR: - Group verbal and written instruction with the use of models to demonstrate the basic use of the AED with the basic ABC's of resuscitation.   General Nutrition Guidelines/Fats and Fiber: -Group instruction provided by verbal, written material, models and posters to present the general guidelines for heart healthy nutrition. Gives an explanation and review of dietary fats and fiber.   Controlling Sodium/Reading Food Labels: -Group verbal and written material supporting the discussion of sodium use in heart healthy nutrition. Review and explanation with models, verbal and written materials for utilization of the food label.   Exercise Physiology & General Exercise Guidelines: - Group verbal and written instruction with models to review the exercise physiology of the cardiovascular system and associated critical values. Provides general exercise guidelines with specific guidelines to those with heart or lung disease.    Aerobic Exercise & Resistance Training: - Gives group verbal and written instruction on the various components of exercise. Focuses on aerobic and resistive training programs and the benefits of this training and how to safely progress through these programs..   Flexibility, Balance, Mind/Body Relaxation: Provides group verbal/written instruction on the benefits of flexibility and balance training, including mind/body exercise modes such as yoga, pilates and tai  chi.  Demonstration and skill practice provided.   Stress and Anxiety: - Provides group verbal and written instruction about the health risks of elevated stress and causes of high stress.  Discuss the correlation  between heart/lung disease and anxiety and treatment options. Review healthy ways to manage with stress and anxiety.   Depression: - Provides group verbal and written instruction on the correlation between heart/lung disease and depressed mood, treatment options, and the stigmas associated with seeking treatment.   Anatomy & Physiology of the Heart: - Group verbal and written instruction and models provide basic cardiac anatomy and physiology, with the coronary electrical and arterial systems. Review of Valvular disease and Heart Failure   Cardiac Procedures: - Group verbal and written instruction to review commonly prescribed medications for heart disease. Reviews the medication, class of the drug, and side effects. Includes the steps to properly store meds and maintain the prescription regimen. (beta blockers and nitrates)   Cardiac Medications I: - Group verbal and written instruction to review commonly prescribed medications for heart disease. Reviews the medication, class of the drug, and side effects. Includes the steps to properly store meds and maintain the prescription regimen.   Cardiac Medications II: -Group verbal and written instruction to review commonly prescribed medications for heart disease. Reviews the medication, class of the drug, and side effects. (all other drug classes)    Go Sex-Intimacy & Heart Disease, Get SMART - Goal Setting: - Group verbal and written instruction through game format to discuss heart disease and the return to sexual intimacy. Provides group verbal and written material to discuss and apply goal setting through the application of the S.M.A.R.T. Method.   Other Matters of the Heart: - Provides group verbal, written materials and  models to describe Stable Angina and Peripheral Artery. Includes description of the disease process and treatment options available to the cardiac patient.   Exercise & Equipment Safety: - Individual verbal instruction and demonstration of equipment use and safety with use of the equipment.   Cardiac Rehab from 05/17/2018 in [facility] Cardiac and Pulmonary Rehab  Date  05/17/18  Educator  [facility]  Instruction Review Code  1- Verbalizes Understanding      Infection Prevention: - Provides verbal and written material to individual with discussion of infection control including proper hand washing and proper equipment cleaning during exercise session.   Cardiac Rehab from 05/17/2018 in [facility] Cardiac and Pulmonary Rehab  Date  05/17/18  Educator  [facility]  Instruction Review Code  1- Verbalizes Understanding      Falls Prevention: - Provides verbal and written material to individual with discussion of falls prevention and safety.   Cardiac Rehab from 05/17/2018 in [facility] Cardiac and Pulmonary Rehab  Date  05/17/18  Educator  [facility]  Instruction Review Code  1- Verbalizes Understanding      Diabetes: - Individual verbal and written instruction to review signs/symptoms of diabetes, desired ranges of glucose level fasting, after meals and with exercise. Acknowledge that pre and post exercise glucose checks will be done for 3 sessions at entry of program.   Know Your Numbers and Risk Factors: -Group verbal and written instruction about important numbers in your health.  Discussion of what are risk factors and how they play a role in the disease process.  Review of Cholesterol, Blood Pressure, Diabetes, and BMI and the role they play in your overall health.   Sleep Hygiene: -Provides group verbal and written instruction about how sleep can affect your health.  Define sleep hygiene, discuss sleep cycles and impact of sleep habits. Review good sleep hygiene tips.    Other: -Provides group and verbal instruction  on various topics (see comments)   Knowledge Questionnaire Score: Knowledge  Questionnaire Score - 05/17/18 1411      Knowledge Questionnaire Score   Pre Score  24/26   correct answers reviewed with Shanon Brow. Focus on exercise      Core Components/Risk Factors/Patient Goals at Admission: Personal Goals and Risk Factors at Admission - 05/17/18 1409      Core Components/Risk Factors/Patient Goals on Admission    Weight Management  Yes;Weight Maintenance    Intervention  Weight Management: Develop a combined nutrition and exercise program designed to reach desired caloric intake, while maintaining appropriate intake of nutrient and fiber, sodium and fats, and appropriate energy expenditure required for the weight goal.;Weight Management: Provide education and appropriate resources to help participant work on and attain dietary goals.    Admit Weight  215 lb 8 oz (97.8 kg)    Expected Outcomes  Short Term: Continue to assess and modify interventions until short term weight is achieved;Long Term: Adherence to nutrition and physical activity/exercise program aimed toward attainment of established weight goal;Weight Maintenance: Understanding of the daily nutrition guidelines, which includes 25-35% calories from fat, 7% or less cal from saturated fats, less than 26m cholesterol, less than 1.5gm of sodium, & 5 or more servings of fruits and vegetables daily;Understanding recommendations for meals to include 15-35% energy as protein, 25-35% energy from fat, 35-60% energy from carbohydrates, less than 2074mof dietary cholesterol, 20-35 gm of total fiber daily;Understanding of distribution of calorie intake throughout the day with the consumption of 4-5 meals/snacks    Hypertension  Yes    Intervention  Provide education on lifestyle modifcations including regular physical activity/exercise, weight management, moderate sodium restriction and increased consumption of fresh fruit, vegetables, and low fat  dairy, alcohol moderation, and smoking cessation.;Monitor prescription use compliance.    Expected Outcomes  Short Term: Continued assessment and intervention until BP is < 140/908mG in hypertensive participants. < 130/39m95m in hypertensive participants with diabetes, heart failure or chronic kidney disease.;Long Term: Maintenance of blood pressure at goal levels.       Core Components/Risk Factors/Patient Goals Review:    Core Components/Risk Factors/Patient Goals at Discharge (Final Review):    ITP Comments: ITP Comments    Row Name 05/17/18 1354           ITP Comments  Med Review completed. Initial ITP created. Diagnosis can be found in [facility]30/19          Comments: Initial ITP

## 2018-05-29 ENCOUNTER — Encounter: Payer: Medicare HMO | Attending: Internal Medicine | Admitting: *Deleted

## 2018-05-29 DIAGNOSIS — I10 Essential (primary) hypertension: Secondary | ICD-10-CM | POA: Insufficient documentation

## 2018-05-29 DIAGNOSIS — Z952 Presence of prosthetic heart valve: Secondary | ICD-10-CM | POA: Diagnosis not present

## 2018-05-29 DIAGNOSIS — F419 Anxiety disorder, unspecified: Secondary | ICD-10-CM | POA: Diagnosis not present

## 2018-05-29 DIAGNOSIS — Z79899 Other long term (current) drug therapy: Secondary | ICD-10-CM | POA: Insufficient documentation

## 2018-05-29 NOTE — Progress Notes (Signed)
Daily Session Note  Patient Details  Name: Dominic Osborn MRN: [id number] Date of Birth: 27-Sep-1945 Referring Provider:     Cardiac Rehab from 05/17/2018 in [facility] Cardiac and Pulmonary Rehab  Referring Provider  [name]      Encounter Date: 05/29/2018  Check In: Session Check In - 05/29/18 0915      Check-In   Supervising physician immediately available to respond to emergencies  See telemetry face sheet for immediately available ER MD    Location  [facility]-Cardiac & Pulmonary Rehab    Staff Present  Heath Lark, RN, BSN, CCRP;Jeanna Durrell BS, Exercise Physiologist;Teah Votaw [place], [place], RCEP, CCRP, Exercise Physiologist    Medication changes reported      No    Fall or balance concerns reported     No    Warm-up and Cool-down  Performed as group-led Higher education careers adviser Performed  Yes    VAD Patient?  No    PAD/SET Patient?  No      Pain Assessment   Currently in Pain?  No/denies          Social History   Tobacco Use  Smoking Status Never Smoker  Smokeless Tobacco Never Used    Goals Met:  Exercise tolerated well Personal goals reviewed No report of cardiac concerns or symptoms Strength training completed today  Goals Unmet:  Not Applicable  Comments: First full day of exercise!  Patient was oriented to gym and equipment including functions, settings, policies, and procedures.  Patient's individual exercise prescription and treatment plan were reviewed.  All starting workloads were established based on the results of the 6 minute walk test done at initial orientation visit.  The plan for exercise progression was also introduced and progression will be customized based on patient's performance and goals.    Dr. Emily Filbert is Medical Director for [facility].

## 2018-05-31 DIAGNOSIS — Z952 Presence of prosthetic heart valve: Secondary | ICD-10-CM

## 2018-05-31 NOTE — Progress Notes (Signed)
Daily Session Note  Patient Details  Name: Dominic Osborn MRN: [id number] Date of Birth: May 09, 1945 Referring Provider:     Cardiac Rehab from 05/17/2018 in [facility] Cardiac and Pulmonary Rehab  Referring Provider  [name]      Encounter Date: 05/31/2018  Check In: Session Check In - 05/31/18 0917      Check-In   Supervising physician immediately available to respond to emergencies  See telemetry face sheet for immediately available ER MD    Location  [facility]-Cardiac & Pulmonary Rehab    Staff Present  Gerlene Burdock, RN, BSN;Moataz Tavis BS, Exercise Physiologist;Jessica Luan Pulling, MA, RCEP, CCRP, Exercise Physiologist    Fall or balance concerns reported     No    Warm-up and Cool-down  Performed as group-led Higher education careers adviser Performed  Yes    VAD Patient?  No    PAD/SET Patient?  No      Pain Assessment   Currently in Pain?  No/denies    Multiple Pain Sites  No          Social History   Tobacco Use  Smoking Status Never Smoker  Smokeless Tobacco Never Used    Goals Met:  Independence with exercise equipment Exercise tolerated well No report of cardiac concerns or symptoms Strength training completed today  Goals Unmet:  Not Applicable  Comments: Pt able to follow exercise prescription today without complaint.  Will continue to monitor for progression.    Dr. Emily Filbert is Medical Director for [place] and [facility].

## 2018-06-05 DIAGNOSIS — Z952 Presence of prosthetic heart valve: Secondary | ICD-10-CM | POA: Diagnosis not present

## 2018-06-05 NOTE — Progress Notes (Signed)
Daily Session Note  Patient Details  Name: Dominic Osborn MRN: [id number] Date of Birth: [birth year] Referring Provider:     Cardiac Rehab from 05/17/2018 in [facility] Cardiac and Pulmonary Rehab  Referring Provider  [name]      Encounter Date: 06/05/2018  Check In: Session Check In - 06/05/18 1154      Check-In   Supervising physician immediately available to respond to emergencies  See telemetry face sheet for immediately available ER MD    Location  [facility]-Cardiac & Pulmonary Rehab    Staff Present  Heath Lark, RN, BSN, CCRP;Jeanna Durrell BS, Exercise Physiologist;Amanda Sommer, BA, ACSM CEP, Exercise Physiologist    Medication changes reported      No    Fall or balance concerns reported     No    Tobacco Cessation  No Change    Warm-up and Cool-down  Performed as group-led instruction    Resistance Training Performed  Yes    VAD Patient?  No    PAD/SET Patient?  No      Pain Assessment   Currently in Pain?  No/denies    Multiple Pain Sites  No          Social History   Tobacco Use  Smoking Status Never Smoker  Smokeless Tobacco Never Used    Goals Met:  Independence with exercise equipment Exercise tolerated well No report of cardiac concerns or symptoms Strength training completed today  Goals Unmet:  Not Applicable  Comments: Pt able to follow exercise prescription today without complaint.  Will continue to monitor for progression.    Dr. Emily Filbert is Medical Director for [name] and [facility].

## 2018-06-07 DIAGNOSIS — Z952 Presence of prosthetic heart valve: Secondary | ICD-10-CM

## 2018-06-07 NOTE — Progress Notes (Signed)
Daily Session Note  Patient Details  Name: Dominic Osborn MRN: [id number] Date of Birth: [birth year] Referring Provider:     Cardiac Rehab from 05/17/2018 in [facility] Cardiac and Pulmonary Rehab  Referring Provider  [name]      Encounter Date: 06/07/2018  Check In: Session Check In - 06/07/18 0917      Check-In   Supervising physician immediately available to respond to emergencies  See telemetry face sheet for immediately available ER MD    Location  [facility]-Cardiac & Pulmonary Rehab    Staff Present  Gerlene Burdock, RN, BSN;Jeanna Durrell BS, Exercise Physiologist;Jessica [place], MA, RCEP, CCRP, Exercise Physiologist    Medication changes reported      No    Fall or balance concerns reported     No    Tobacco Cessation  No Change    Warm-up and Cool-down  Performed as group-led instruction    Resistance Training Performed  Yes    VAD Patient?  No    PAD/SET Patient?  No      Pain Assessment   Currently in Pain?  No/denies          Social History   Tobacco Use  Smoking Status Never Smoker  Smokeless Tobacco Never Used    Goals Met:  Independence with exercise equipment Exercise tolerated well Personal goals reviewed No report of cardiac concerns or symptoms Strength training completed today  Goals Unmet:  Not Applicable  Comments: Pt able to follow exercise prescription today without complaint.  Will continue to monitor for progression.   Reviewed home exercise with pt today.  Pt plans to walk and continue with Silver Sneakers class at [facility] for exercise.  He is also planning to return to his sit ups and push ups.  Reviewed THR, pulse, RPE, sign and symptoms, NTG use, and when to call 911 or MD.  Also discussed weather considerations and indoor options.  Pt voiced understanding.   Dr. Emily Filbert is Medical Director for [place] and [facility].

## 2018-06-12 DIAGNOSIS — Z952 Presence of prosthetic heart valve: Secondary | ICD-10-CM

## 2018-06-12 NOTE — Progress Notes (Signed)
Daily Session Note  Patient Details  Name: Dominic Osborn MRN: [id number] Date of Birth: [birth year] Referring Provider:     Cardiac Rehab from 05/17/2018 in [facility] Cardiac and Pulmonary Rehab  Referring Provider  [name]      Encounter Date: 06/12/2018  Check In: Session Check In - 06/12/18 0917      Check-In   Supervising physician immediately available to respond to emergencies  See telemetry face sheet for immediately available ER MD    Location  [facility]-Cardiac & Pulmonary Rehab    Staff Present  Justin Mend Lorre Nick, MA, RCEP, CCRP, Exercise Physiologist;Susanne Bice, RN, BSN, CCRP    Medication changes reported      No    Fall or balance concerns reported     No    Warm-up and Cool-down  Performed as group-led instruction    Resistance Training Performed  Yes    VAD Patient?  No    PAD/SET Patient?  No      Pain Assessment   Currently in Pain?  No/denies          Social History   Tobacco Use  Smoking Status Never Smoker  Smokeless Tobacco Never Used    Goals Met:  Independence with exercise equipment Exercise tolerated well No report of cardiac concerns or symptoms Strength training completed today  Goals Unmet:  Not Applicable  Comments: Pt able to follow exercise prescription today without complaint.  Will continue to monitor for progression.    Dr. Emily Filbert is Medical Director for [place] and [facility].

## 2018-06-13 ENCOUNTER — Encounter: Payer: Self-pay | Admitting: *Deleted

## 2018-06-13 DIAGNOSIS — Z952 Presence of prosthetic heart valve: Secondary | ICD-10-CM

## 2018-06-13 NOTE — Progress Notes (Signed)
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 written material to discuss and apply goal setting through the application of the S.M.A.R.T. Method.   Cardiac Rehab from 06/12/2018 in [facility] Cardiac and Pulmonary Rehab  Date  06/07/18  Educator  CE  Instruction Review Code  1- Verbalizes Understanding      Other Matters of the Heart: - Provides group verbal, written materials and models to describe Stable Angina and Peripheral Artery. Includes description of the disease process and treatment options available to the cardiac patient.   Exercise & Equipment Safety: - Individual verbal instruction and demonstration of equipment use and safety with use of the equipment.   Cardiac Rehab from 06/12/2018 in [facility] Cardiac and Pulmonary Rehab  Date  05/17/18  Educator  [place]  Instruction Review Code  1- Verbalizes Understanding      Infection Prevention: - Provides verbal and written material to individual with discussion of infection control including proper hand  washing and proper equipment cleaning during exercise session.   Cardiac Rehab from 06/12/2018 in [facility] Cardiac and Pulmonary Rehab  Date  05/17/18  Educator  [facility]  Instruction Review Code  1- Verbalizes Understanding      Falls Prevention: - Provides verbal and written material to individual with discussion of falls prevention and safety.   Cardiac Rehab from 06/12/2018 in [facility] Cardiac and Pulmonary Rehab  Date  05/17/18  Educator  [facility]  Instruction Review Code  1- Verbalizes Understanding      Diabetes: - Individual verbal and written instruction to review signs/symptoms of diabetes, desired ranges of glucose level fasting, after meals and with exercise. Acknowledge that pre and post exercise glucose checks will be done for 3 sessions at entry of program.   Know Your Numbers and Risk Factors: -Group verbal and written instruction about important numbers in your health.  Discussion of what are risk factors and how they play a role in the disease process.  Review of Cholesterol, Blood Pressure, Diabetes, and BMI and the role they play in your overall health.   Sleep Hygiene: -Provides group verbal and written instruction about how sleep can affect your health.  Define sleep hygiene, discuss sleep cycles and impact of sleep habits. Review good sleep hygiene tips.    Other: -Provides group and verbal instruction on various topics (see comments)   Knowledge Questionnaire Score: Knowledge Questionnaire Score - 05/17/18 1411      Knowledge Questionnaire Score   Pre Score  24/26   correct answers reviewed with Shanon Brow. Focus on exercise      Core Components/Risk Factors/Patient Goals at Admission: Personal Goals and Risk Factors at Admission - 05/17/18 1409      Core Components/Risk Factors/Patient Goals on Admission    Weight Management  Yes;Weight Maintenance    Intervention  Weight Management: Develop a combined nutrition and exercise program designed to reach desired caloric  intake, while maintaining appropriate intake of nutrient and fiber, sodium and fats, and appropriate energy expenditure required for the weight goal.;Weight Management: Provide education and appropriate resources to help participant work on and attain dietary goals.    Admit Weight  215 lb 8 oz (97.8 kg)    Expected Outcomes  Short Term: Continue to assess and modify interventions until short term weight is achieved;Long Term: Adherence to nutrition and physical activity/exercise program aimed toward attainment of established weight goal;Weight Maintenance: Understanding of the daily nutrition guidelines, which includes 25-35% calories from fat, 7% or less cal from saturated fats, less than 259m cholesterol, less than 1.5gm of sodium, & 5 or more servings of fruits and vegetables daily;Understanding recommendations for meals to include 15-35% energy as protein, 25-35% energy from fat, 35-60% energy from carbohydrates, less than 20102mof dietary cholesterol, 20-35 gm of total fiber daily;Understanding of distribution of calorie intake throughout the day with the consumption of 4-5 meals/snacks    Hypertension  Yes    Intervention  Provide education on lifestyle modifcations including regular physical activity/exercise, weight management, moderate sodium restriction and increased consumption of fresh fruit, vegetables, and low fat dairy, alcohol moderation, and smoking cessation.;Monitor prescription use compliance.    Expected Outcomes  Short Term: Continued assessment and intervention until BP is < 140/9018mG in hypertensive participants. < 130/34m8m in  hypertensive participants with diabetes, heart failure or chronic kidney disease.;Long Term: Maintenance of blood pressure at goal levels.       Core Components/Risk Factors/Patient Goals Review:  Goals and Risk Factor Review    Row Name 06/07/18 1109             Core Components/Risk Factors/Patient Goals Review   Personal Goals Review  Weight  Management/Obesity;Lipids;Hypertension       Review  Tarron has been doing well in rehab. His weight has been steady and he weighs daily.  His blood pressures are doing better.  They were high his first day, but have since evened back out. He does check them daily at home along with his weight.  We will continue to keep a close eye on his pressures.         Expected Outcomes  Short: Continue to weigh daily and watch blood pressures. Long: Continue to monitor risk factors.           Core Components/Risk Factors/Patient Goals at Discharge (Final Review):  Goals and Risk Factor Review - 06/07/18 1109      Core Components/Risk Factors/Patient Goals Review   Personal Goals Review  Weight Management/Obesity;Lipids;Hypertension    Review  Oakley has been doing well in rehab. His weight has been steady and he weighs daily.  His blood pressures are doing better.  They were high his first day, but have since evened back out. He does check them daily at home along with his weight.  We will continue to keep a close eye on his pressures.      Expected Outcomes  Short: Continue to weigh daily and watch blood pressures. Long: Continue to monitor risk factors.        ITP Comments: ITP Comments    Row Name 05/17/18 1354 06/13/18 6701         ITP Comments  Med Review completed. Initial ITP created. Diagnosis can be found in [facility] 04/16/18  30 day review. Continue with ITP unless directed changes by Medical Director chart review. New to program         Comments:

## 2018-06-19 ENCOUNTER — Encounter: Payer: Medicare HMO | Attending: Internal Medicine

## 2018-06-19 DIAGNOSIS — Z952 Presence of prosthetic heart valve: Secondary | ICD-10-CM | POA: Diagnosis present

## 2018-06-19 DIAGNOSIS — I10 Essential (primary) hypertension: Secondary | ICD-10-CM | POA: Insufficient documentation

## 2018-06-19 DIAGNOSIS — F419 Anxiety disorder, unspecified: Secondary | ICD-10-CM | POA: Diagnosis not present

## 2018-06-19 DIAGNOSIS — Z79899 Other long term (current) drug therapy: Secondary | ICD-10-CM | POA: Insufficient documentation

## 2018-06-19 NOTE — Progress Notes (Signed)
Daily Session Note  Patient Details  Name: Dominic Osborn MRN: [id number] Date of Birth: September 18, 1945 Referring Provider:     Cardiac Rehab from 05/17/2018 in [facility] Cardiac and Pulmonary Rehab  Referring Provider  [name]      Encounter Date: 06/19/2018  Check In: Session Check In - 06/19/18 0918      Check-In   Supervising physician immediately available to respond to emergencies  See telemetry face sheet for immediately available ER MD    Location  [facility]-Cardiac & Pulmonary Rehab    Staff Present  Alberteen Sam, MA, RCEP, CCRP, Exercise Physiologist;Joseph Hood RCP,RRT,BSRT;Jeanna Durrell BS, Exercise Physiologist;Susanne Bice, RN, BSN, CCRP    Medication changes reported      No    Fall or balance concerns reported     No    Warm-up and Cool-down  Performed as group-led Higher education careers adviser Performed  Yes    VAD Patient?  No    PAD/SET Patient?  No      Pain Assessment   Currently in Pain?  No/denies          Social History   Tobacco Use  Smoking Status Never Smoker  Smokeless Tobacco Never Used    Goals Met:  Independence with exercise equipment Exercise tolerated well No report of cardiac concerns or symptoms Strength training completed today  Goals Unmet:  Not Applicable  Comments: Pt able to follow exercise prescription today without complaint.  Will continue to monitor for progression.    Dr. Emily Filbert is Medical Director for [place] and [facility].

## 2018-06-21 DIAGNOSIS — Z952 Presence of prosthetic heart valve: Secondary | ICD-10-CM | POA: Diagnosis not present

## 2018-06-21 NOTE — Progress Notes (Signed)
Daily Session Note  Patient Details  Name: Dominic Osborn MRN: [id number] Date of Birth: June 11, 1945 Referring Provider:     Cardiac Rehab from 05/17/2018 in [facility] Cardiac and Pulmonary Rehab  Referring Provider  [name]      Encounter Date: 06/21/2018  Check In: Session Check In - 06/21/18 0911      Check-In   Supervising physician immediately available to respond to emergencies  See telemetry face sheet for immediately available ER MD    Location  [facility]-Cardiac & Pulmonary Rehab    Staff Present  Jasper Loser BS, Exercise Physiologist;Carroll Enterkin, RN, BSN;Jessica Luan [place], MA, RCEP, CCRP, Exercise Physiologist;Random Dobrowski Tessie Fass RCP,RRT,BSRT    Medication changes reported      No    Fall or balance concerns reported     No    Warm-up and Cool-down  Performed as group-led instruction    Resistance Training Performed  Yes    VAD Patient?  No    PAD/SET Patient?  No      Pain Assessment   Currently in Pain?  No/denies          Social History   Tobacco Use  Smoking Status Never Smoker  Smokeless Tobacco Never Used    Goals Met:  Independence with exercise equipment Exercise tolerated well No report of cardiac concerns or symptoms Strength training completed today  Goals Unmet:  Not Applicable  Comments: Pt able to follow exercise prescription today without complaint.  Will continue to monitor for progression.   Dr. Emily Filbert is Medical Director for [place] and [facility].

## 2018-06-26 ENCOUNTER — Encounter: Payer: Medicare HMO | Admitting: *Deleted

## 2018-06-26 DIAGNOSIS — Z952 Presence of prosthetic heart valve: Secondary | ICD-10-CM | POA: Diagnosis not present

## 2018-06-26 NOTE — Progress Notes (Signed)
Daily Session Note  Patient Details  Name: Dominic Osborn MRN: [id number] Date of Birth: July 03, 1945 Referring Provider:     Cardiac Rehab from 05/17/2018 in [facility] Cardiac and Pulmonary Rehab  Referring Provider  [name]      Encounter Date: 06/26/2018  Check In: Session Check In - 06/26/18 0920      Check-In   Supervising physician immediately available to respond to emergencies  See telemetry face sheet for immediately available ER MD    Location  [facility]-Cardiac & Pulmonary Rehab    Staff Present  Heath Lark, RN, BSN, CCRP;Jeanna Durrell BS, Exercise Physiologist;Jessica [place], MA, RCEP, CCRP, Exercise Physiologist;Joseph [employer], [place], ACSM CEP, Exercise Physiologist    Medication changes reported      No    Fall or balance concerns reported     No    Warm-up and Cool-down  Performed as group-led instruction    Resistance Training Performed  Yes    VAD Patient?  No    PAD/SET Patient?  No      Pain Assessment   Currently in Pain?  No/denies          Social History   Tobacco Use  Smoking Status Never Smoker  Smokeless Tobacco Never Used    Goals Met:  Independence with exercise equipment Exercise tolerated well Personal goals reviewed No report of cardiac concerns or symptoms Strength training completed today  Goals Unmet:  Not Applicable  Comments: Pt able to follow exercise prescription today without complaint.  Will continue to monitor for progression.    Dr. Emily Filbert is Medical Director for [place] and [facility].

## 2018-06-28 ENCOUNTER — Other Ambulatory Visit: Payer: Self-pay

## 2018-06-28 DIAGNOSIS — Z952 Presence of prosthetic heart valve: Secondary | ICD-10-CM | POA: Diagnosis not present

## 2018-06-28 NOTE — Progress Notes (Signed)
Daily Session Note  Patient Details  Name: Dominic Osborn MRN: [id number] Date of Birth: 09/13/1945 Referring Provider:     Cardiac Rehab from 05/17/2018 in [facility] Cardiac and Pulmonary Rehab  Referring Provider  [name]      Encounter Date: 06/28/2018  Check In: Session Check In - 06/28/18 1231      Check-In   Supervising physician immediately available to respond to emergencies  See telemetry face sheet for immediately available ER MD    Location  [facility]-Cardiac & Pulmonary Rehab    Staff Present  Heath Lark, RN, BSN, CCRP;Jessica [place], MA, RCEP, CCRP, Exercise Physiologist;Lajoyce Tamura Tessie Fass RCP,RRT,BSRT    Medication changes reported      No    Fall or balance concerns reported     No    Warm-up and Cool-down  Performed as group-led instruction    Resistance Training Performed  Yes    VAD Patient?  No    PAD/SET Patient?  No      Pain Assessment   Currently in Pain?  No/denies          Social History   Tobacco Use  Smoking Status Never Smoker  Smokeless Tobacco Never Used    Goals Met:  Independence with exercise equipment Exercise tolerated well No report of cardiac concerns or symptoms Strength training completed today  Goals Unmet:  Not Applicable  Comments: Pt able to follow exercise prescription today without complaint.  Will continue to monitor for progression.    Dr. Emily Filbert is Medical Director for [facility] and [facility].

## 2018-07-11 ENCOUNTER — Encounter: Payer: Self-pay | Admitting: *Deleted

## 2018-07-11 DIAGNOSIS — Z952 Presence of prosthetic heart valve: Secondary | ICD-10-CM

## 2018-07-11 NOTE — Progress Notes (Signed)
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 to bathroom so he has not been sleeping as well.  His health continues to be a major stress.  He is his wife's primary caregiver and his brother has ALS and continues to decline.  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  They were able to visit last week.  He tries to stay postive and keep things moving. He has a follow up appointment today with Dr. [name].     Expected Outcomes  Short: Meet with doctor about UTI and surgical follow up.  Long: Continue to recover sleep and stay postive.     Interventions  Stress management education;Encouraged to attend Cardiac Rehabilitation for the exercise    Continue Psychosocial Services   Follow up required by staff       Vocational Rehabilitation: Provide vocational rehab assistance to qualifying candidates.   Vocational Rehab Evaluation & Intervention: Vocational Rehab - 05/17/18 1412       Initial Vocational Rehab Evaluation & Intervention   Assessment shows need for Vocational Rehabilitation  No       Education: Education Goals: Education classes will be provided on a variety of topics geared toward better understanding of heart health and risk factor modification. Participant will state understanding/return demonstration of topics presented as noted by education test scores.  Learning Barriers/Preferences: Learning Barriers/Preferences - 05/17/18 1411      Learning Barriers/Preferences   Learning Barriers  None    Learning Preferences  None       Education Topics:  AED/CPR: - Group verbal and written instruction with the use of models to demonstrate the basic use of the AED with the basic ABC's of resuscitation.   Cardiac Rehab from 06/28/2018 in [facility] Cardiac and Pulmonary Rehab  Date  06/12/18  Educator  SB  Instruction Review Code  1- Verbalizes Understanding      General Nutrition Guidelines/Fats and Fiber: -Group instruction provided by verbal, written material, models and posters to present the general guidelines for heart healthy nutrition. Gives an explanation and review of dietary fats and fiber.   Cardiac Rehab from 06/28/2018 in [facility] Cardiac and Pulmonary Rehab  Date  06/26/18  Educator  [facility]  Instruction Review Code  1- Verbalizes Understanding      Controlling Sodium/Reading Food Labels: -Group verbal and written material supporting the discussion of sodium use in heart healthy nutrition. Review and explanation with models, verbal and written materials for utilization of the food label.   Cardiac Rehab from 06/28/2018 in [facility] Cardiac and Pulmonary Rehab  Date  06/28/18  Educator  [facility]  Instruction Review Code  1- Verbalizes Understanding      Exercise Physiology & General Exercise Guidelines: - Group verbal and written instruction with models to review the exercise physiology of the cardiovascular system and associated critical values. Provides  general exercise guidelines with specific guidelines to those with heart or  lung disease.    Aerobic Exercise & Resistance Training: - Gives group verbal and written instruction on the various components of exercise. Focuses on aerobic and resistive training programs and the benefits of this training and how to safely progress through these programs..   Flexibility, Balance, Mind/Body Relaxation: Provides group verbal/written instruction on the benefits of flexibility and balance training, including mind/body exercise modes such as yoga, pilates and tai chi.  Demonstration and skill practice provided.   Stress and Anxiety: - Provides group verbal and written instruction about the health risks of elevated stress and causes of high stress.  Discuss the correlation between heart/lung disease and anxiety and treatment options. Review healthy ways to manage with stress and anxiety.   Cardiac Rehab from 06/28/2018 in [facility] Cardiac and Pulmonary Rehab  Date  06/05/18  Educator  [facility]  Instruction Review Code  1- Verbalizes Understanding      Depression: - Provides group verbal and written instruction on the correlation between heart/lung disease and depressed mood, treatment options, and the stigmas associated with seeking treatment.   Cardiac Rehab from 06/28/2018 in [facility] Cardiac and Pulmonary Rehab  Date  06/19/18  Educator  [facility]  Instruction Review Code  1- Verbalizes Understanding      Anatomy & Physiology of the Heart: - Group verbal and written instruction and models provide basic cardiac anatomy and physiology, with the coronary electrical and arterial systems. Review of Valvular disease and Heart Failure   Cardiac Procedures: - Group verbal and written instruction to review commonly prescribed medications for heart disease. Reviews the medication, class of the drug, and side effects. Includes the steps to properly store meds and maintain the prescription regimen. (beta blockers and  nitrates)   Cardiac Rehab from 06/28/2018 in [facility] Cardiac and Pulmonary Rehab  Date  06/07/18  Educator  CE  Instruction Review Code  1- Verbalizes Understanding      Cardiac Medications I: - Group verbal and written instruction to review commonly prescribed medications for heart disease. Reviews the medication, class of the drug, and side effects. Includes the steps to properly store meds and maintain the prescription regimen.   Cardiac Rehab from 06/28/2018 in [facility] Cardiac and Pulmonary Rehab  Date  05/29/18  Educator  SB  Instruction Review Code  1- Verbalizes Understanding      Cardiac Medications II: -Group verbal and written instruction to review commonly prescribed medications for heart disease. Reviews the medication, class of the drug, and side effects. (all other drug classes)   Cardiac Rehab from 06/28/2018 in [facility] Cardiac and Pulmonary Rehab  Date  06/21/18  Educator  CE  Instruction Review Code  1- Verbalizes Understanding       Go Sex-Intimacy & Heart Disease, Get SMART - Goal Setting: - Group verbal and written instruction through game format to discuss heart disease and the return to sexual intimacy. Provides group verbal and written material to discuss and apply goal setting through the application of the S.M.A.R.T. Method.   Cardiac Rehab from 06/28/2018 in [facility] Cardiac and Pulmonary Rehab  Date  06/07/18  Educator  CE  Instruction Review Code  1- Verbalizes Understanding      Other Matters of the Heart: - Provides group verbal, written materials and models to describe Stable Angina and Peripheral Artery. Includes description of the disease process and treatment options available to the cardiac patient.   Exercise & Equipment Safety: - Individual verbal instruction and demonstration of equipment use and safety with use of the  equipment.   Cardiac Rehab from 06/28/2018 in [facility] Cardiac and Pulmonary Rehab  Date  05/17/18  Educator  [facility]  Instruction Review Code   1- Verbalizes Understanding      Infection Prevention: - Provides verbal and written material to individual with discussion of infection control including proper hand washing and proper equipment cleaning during exercise session.   Cardiac Rehab from 06/28/2018 in [facility] Cardiac and Pulmonary Rehab  Date  05/17/18  Educator  [facility]  Instruction Review Code  1- Verbalizes Understanding      Falls Prevention: - Provides verbal and written material to individual with discussion of falls prevention and safety.   Cardiac Rehab from 06/28/2018 in [facility] Cardiac and Pulmonary Rehab  Date  05/17/18  Educator  [facility]  Instruction Review Code  1- Verbalizes Understanding      Diabetes: - Individual verbal and written instruction to review signs/symptoms of diabetes, desired ranges of glucose level fasting, after meals and with exercise. Acknowledge that pre and post exercise glucose checks will be done for 3 sessions at entry of program.   Know Your Numbers and Risk Factors: -Group verbal and written instruction about important numbers in your health.  Discussion of what are risk factors and how they play a role in the disease process.  Review of Cholesterol, Blood Pressure, Diabetes, and BMI and the role they play in your overall health.   Cardiac Rehab from 06/28/2018 in [facility] Cardiac and Pulmonary Rehab  Date  06/21/18  Educator  CE  Instruction Review Code  1- Verbalizes Understanding      Sleep Hygiene: -Provides group verbal and written instruction about how sleep can affect your health.  Define sleep hygiene, discuss sleep cycles and impact of sleep habits. Review good sleep hygiene tips.    Other: -Provides group and verbal instruction on various topics (see comments)   Knowledge Questionnaire Score: Knowledge Questionnaire Score - 05/17/18 1411      Knowledge Questionnaire Score   Pre Score  24/26   correct answers reviewed with Shanon Brow. Focus on exercise      Core Components/Risk  Factors/Patient Goals at Admission: Personal Goals and Risk Factors at Admission - 05/17/18 1409      Core Components/Risk Factors/Patient Goals on Admission    Weight Management  Yes;Weight Maintenance    Intervention  Weight Management: Develop a combined nutrition and exercise program designed to reach desired caloric intake, while maintaining appropriate intake of nutrient and fiber, sodium and fats, and appropriate energy expenditure required for the weight goal.;Weight Management: Provide education and appropriate resources to help participant work on and attain dietary goals.    Admit Weight  215 lb 8 oz (97.8 kg)    Expected Outcomes  Short Term: Continue to assess and modify interventions until short term weight is achieved;Long Term: Adherence to nutrition and physical activity/exercise program aimed toward attainment of established weight goal;Weight Maintenance: Understanding of the daily nutrition guidelines, which includes 25-35% calories from fat, 7% or less cal from saturated fats, less than '200mg'$  cholesterol, less than 1.5gm of sodium, & 5 or more servings of fruits and vegetables daily;Understanding recommendations for meals to include 15-35% energy as protein, 25-35% energy from fat, 35-60% energy from carbohydrates, less than '200mg'$  of dietary cholesterol, 20-35 gm of total fiber daily;Understanding of distribution of calorie intake throughout the day with the consumption of 4-5 meals/snacks    Hypertension  Yes    Intervention  Provide education on lifestyle modifcations including regular physical activity/exercise, weight management,  moderate sodium restriction and increased consumption of fresh fruit, vegetables, and low fat dairy, alcohol moderation, and smoking cessation.;Monitor prescription use compliance.    Expected Outcomes  Short Term: Continued assessment and intervention until BP is < 140/48m HG in hypertensive participants. < 130/857mHG in hypertensive participants with  diabetes, heart failure or chronic kidney disease.;Long Term: Maintenance of blood pressure at goal levels.       Core Components/Risk Factors/Patient Goals Review:  Goals and Risk Factor Review    Row Name 06/07/18 1109 06/26/18 1004           Core Components/Risk Factors/Patient Goals Review   Personal Goals Review  Weight Management/Obesity;Lipids;Hypertension  Weight Management/Obesity;Lipids;Hypertension      Review  DaPeras been doing well in rehab. His weight has been steady and he weighs daily.  His blood pressures are doing better.  They were high his first day, but have since evened back out. He does check them daily at home along with his weight.  We will continue to keep a close eye on his pressures.    DaIzellontinues to lose weight despite regaining his appetite.  He has a follow appointment today for his UTI and surgery.  He has not been checking his pressures much at home, but has not noticed them feeling high.  He has a wrist cuff at home.  We talked about taking it more frequently and keeping a record of it.  He is doing well with his medications and just updated his list last week. He is planning to talk to doctor about adding back in his supplements and dropping his statin.       Expected Outcomes  Short: Continue to weigh daily and watch blood pressures. Long: Continue to monitor risk factors.   Short: Talk to doctor about medications.  Start checking blood pressures again.  Long: Continue to monitor risk factors.          Core Components/Risk Factors/Patient Goals at Discharge (Final Review):  Goals and Risk Factor Review - 06/26/18 1004      Core Components/Risk Factors/Patient Goals Review   Personal Goals Review  Weight Management/Obesity;Lipids;Hypertension    Review  DaSkippyontinues to lose weight despite regaining his appetite.  He has a follow appointment today for his UTI and surgery.  He has not been checking his pressures much at home, but has not noticed them  feeling high.  He has a wrist cuff at home.  We talked about taking it more frequently and keeping a record of it.  He is doing well with his medications and just updated his list last week. He is planning to talk to doctor about adding back in his supplements and dropping his statin.     Expected Outcomes  Short: Talk to doctor about medications.  Start checking blood pressures again.  Long: Continue to monitor risk factors.        ITP Comments: ITP Comments    Row Name 05/17/18 1354 06/13/18 0625953/18/20 1033 07/11/18 1214     ITP Comments  Med Review completed. Initial ITP created. [facility]  30 day review. Continue with ITP unless directed changes by Medical Director chart review. New to program  Our program is currently closed due to COVID-19.  We are communicating with patient via phone calls and emails.    30 day review. Continue with ITP unless directed changes by Medical Director chart review.       Comments:

## 2018-07-12 DIAGNOSIS — Z952 Presence of prosthetic heart valve: Secondary | ICD-10-CM

## 2018-07-25 ENCOUNTER — Encounter: Admission: RE | Payer: Self-pay | Source: Home / Self Care

## 2018-07-25 ENCOUNTER — Ambulatory Visit
Admission: RE | Admit: 2018-07-25 | Payer: Medicare HMO | Source: Home / Self Care | Admitting: Unknown Physician Specialty

## 2018-07-25 SURGERY — COLONOSCOPY WITH PROPOFOL
Anesthesia: General

## 2018-08-09 ENCOUNTER — Encounter: Payer: Self-pay | Admitting: *Deleted

## 2018-08-09 DIAGNOSIS — Z952 Presence of prosthetic heart valve: Secondary | ICD-10-CM

## 2018-08-30 ENCOUNTER — Encounter: Payer: Self-pay | Admitting: *Deleted

## 2018-08-30 DIAGNOSIS — Z952 Presence of prosthetic heart valve: Secondary | ICD-10-CM

## 2018-09-21 ENCOUNTER — Encounter: Payer: Self-pay | Admitting: *Deleted

## 2018-09-21 DIAGNOSIS — Z952 Presence of prosthetic heart valve: Secondary | ICD-10-CM

## 2018-09-26 ENCOUNTER — Other Ambulatory Visit: Payer: Self-pay

## 2018-09-26 ENCOUNTER — Other Ambulatory Visit
Admission: RE | Admit: 2018-09-26 | Discharge: 2018-09-26 | Disposition: A | Discharge status: Home / Self Care | Payer: Medicare HMO | Source: Ambulatory Visit | Attending: Unknown Physician Specialty | Admitting: Unknown Physician Specialty

## 2018-09-26 DIAGNOSIS — Z01812 Encounter for preprocedural laboratory examination: Secondary | ICD-10-CM | POA: Insufficient documentation

## 2018-09-26 DIAGNOSIS — Z1159 Encounter for screening for other viral diseases: Secondary | ICD-10-CM | POA: Insufficient documentation

## 2018-09-27 LAB — NOVEL CORONAVIRUS, NAA (HOSP ORDER, SEND-OUT TO REF LAB; TAT 18-24 HRS): SARS-CoV-2, NAA: NOT DETECTED

## 2018-10-01 ENCOUNTER — Ambulatory Visit: Payer: Medicare HMO | Admitting: Anesthesiology

## 2018-10-01 ENCOUNTER — Ambulatory Visit
Admission: RE | Admit: 2018-10-01 | Discharge: 2018-10-01 | Disposition: A | Discharge status: Home / Self Care | Payer: Medicare HMO | Attending: Unknown Physician Specialty | Admitting: Unknown Physician Specialty

## 2018-10-01 ENCOUNTER — Other Ambulatory Visit: Payer: Self-pay

## 2018-10-01 ENCOUNTER — Encounter
Admission: RE | Disposition: A | Discharge status: Home / Self Care | Payer: Self-pay | Source: Home / Self Care | Attending: Unknown Physician Specialty

## 2018-10-01 ENCOUNTER — Encounter: Payer: Self-pay | Admitting: *Deleted

## 2018-10-01 DIAGNOSIS — K5730 Diverticulosis of large intestine without perforation or abscess without bleeding: Secondary | ICD-10-CM | POA: Diagnosis not present

## 2018-10-01 DIAGNOSIS — F419 Anxiety disorder, unspecified: Secondary | ICD-10-CM | POA: Insufficient documentation

## 2018-10-01 DIAGNOSIS — Z86010 Personal history of colonic polyps: Secondary | ICD-10-CM | POA: Insufficient documentation

## 2018-10-01 DIAGNOSIS — I10 Essential (primary) hypertension: Secondary | ICD-10-CM | POA: Insufficient documentation

## 2018-10-01 DIAGNOSIS — D123 Benign neoplasm of transverse colon: Secondary | ICD-10-CM | POA: Diagnosis not present

## 2018-10-01 DIAGNOSIS — K640 First degree hemorrhoids: Secondary | ICD-10-CM | POA: Insufficient documentation

## 2018-10-01 DIAGNOSIS — Z79899 Other long term (current) drug therapy: Secondary | ICD-10-CM | POA: Insufficient documentation

## 2018-10-01 DIAGNOSIS — Z7982 Long term (current) use of aspirin: Secondary | ICD-10-CM | POA: Diagnosis not present

## 2018-10-01 DIAGNOSIS — Z1211 Encounter for screening for malignant neoplasm of colon: Secondary | ICD-10-CM | POA: Insufficient documentation

## 2018-10-01 DIAGNOSIS — Z791 Long term (current) use of non-steroidal anti-inflammatories (NSAID): Secondary | ICD-10-CM | POA: Insufficient documentation

## 2018-10-01 HISTORY — PX: COLONOSCOPY WITH PROPOFOL: SHX5780

## 2018-10-01 SURGERY — COLONOSCOPY WITH PROPOFOL
Anesthesia: General

## 2018-10-01 MED ORDER — MIDAZOLAM HCL 2 MG/2ML IJ SOLN
INTRAMUSCULAR | Status: AC
  Filled 2018-10-01: qty 2

## 2018-10-01 MED ORDER — SODIUM CHLORIDE 0.9 % IV SOLN
INTRAVENOUS | Status: DC
  Administered 2018-10-01: 07:00:00 via INTRAVENOUS

## 2018-10-01 MED ORDER — HYDRALAZINE HCL 20 MG/ML IJ SOLN
INTRAMUSCULAR | Status: AC
  Filled 2018-10-01: qty 1

## 2018-10-01 MED ORDER — EPHEDRINE SULFATE 50 MG/ML IJ SOLN
INTRAMUSCULAR | Status: AC
  Filled 2018-10-01: qty 1

## 2018-10-01 MED ORDER — METOPROLOL TARTRATE 50 MG PO TABS
ORAL_TABLET | ORAL | Status: AC
  Administered 2018-10-01: 50 mg
  Filled 2018-10-01: qty 1

## 2018-10-01 MED ORDER — LIDOCAINE HCL (PF) 2 % IJ SOLN
INTRAMUSCULAR | Status: AC
  Filled 2018-10-01: qty 10

## 2018-10-01 MED ORDER — PHENYLEPHRINE HCL (PRESSORS) 10 MG/ML IV SOLN
INTRAVENOUS | Status: AC
  Filled 2018-10-01: qty 1

## 2018-10-01 MED ORDER — METOPROLOL SUCCINATE ER 25 MG PO TB24: 50.0000 mg | ORAL_TABLET | Freq: Every day | ORAL | Status: DC

## 2018-10-01 MED ORDER — PROPOFOL 500 MG/50ML IV EMUL
INTRAVENOUS | Status: DC | PRN
  Administered 2018-10-01: 50 ug/kg/min via INTRAVENOUS

## 2018-10-01 MED ORDER — PIPERACILLIN-TAZOBACTAM 3.375 G IVPB
INTRAVENOUS | Status: AC
  Administered 2018-10-01: 3.375 g
  Filled 2018-10-01: qty 50

## 2018-10-01 MED ORDER — MIDAZOLAM HCL 5 MG/5ML IJ SOLN
INTRAMUSCULAR | Status: DC | PRN
  Administered 2018-10-01 (×2): 1 mg via INTRAVENOUS

## 2018-10-01 MED ORDER — FENTANYL CITRATE (PF) 100 MCG/2ML IJ SOLN
INTRAMUSCULAR | Status: DC | PRN
  Administered 2018-10-01 (×2): 50 ug via INTRAVENOUS

## 2018-10-01 MED ORDER — PROPOFOL 500 MG/50ML IV EMUL
INTRAVENOUS | Status: AC
  Filled 2018-10-01: qty 50

## 2018-10-01 MED ORDER — LIDOCAINE HCL (PF) 2 % IJ SOLN
INTRAMUSCULAR | Status: DC | PRN
  Administered 2018-10-01: 60 mg

## 2018-10-01 MED ORDER — SODIUM CHLORIDE 0.9 % IV SOLN: INTRAVENOUS | Status: DC

## 2018-10-01 MED ORDER — PIPERACILLIN-TAZOBACTAM 3.375 G IVPB: 3.3750 g | Freq: Once | INTRAVENOUS | Status: DC

## 2018-10-01 MED ORDER — FENTANYL CITRATE (PF) 100 MCG/2ML IJ SOLN
INTRAMUSCULAR | Status: AC
  Filled 2018-10-01: qty 2

## 2018-10-01 MED ORDER — HYDRALAZINE HCL 20 MG/ML IJ SOLN
INTRAMUSCULAR | Status: DC | PRN
  Administered 2018-10-01: 5 mg via INTRAVENOUS

## 2018-10-01 MED ORDER — PROPOFOL 10 MG/ML IV BOLUS
INTRAVENOUS | Status: DC | PRN
  Administered 2018-10-01: 20 mg via INTRAVENOUS
  Administered 2018-10-01: 30 mg via INTRAVENOUS

## 2018-10-01 NOTE — Anesthesia Postprocedure Evaluation (Signed)
Anesthesia Post Note  Patient: Dominic Osborn  Procedure(s) Performed: COLONOSCOPY WITH PROPOFOL (N/A )  Patient location during evaluation: Endoscopy Anesthesia Type: General Level of consciousness: awake and alert Pain management: pain level controlled Vital Signs Assessment: post-procedure vital signs reviewed and stable Respiratory status: spontaneous breathing and respiratory function stable Cardiovascular status: stable Anesthetic complications: no     Last Vitals:  Vitals:   10/01/18 0712 10/01/18 0823  BP: (!) 147/92 110/68  Pulse: 69 (!) 58  Resp: 18 20  Temp: 36.6 C 36.5 C  SpO2: 100% 100%    Last Pain:  Vitals:   10/01/18 0823  TempSrc: Tympanic  PainSc: 0-No pain                 KEPHART,WILLIAM K

## 2018-10-01 NOTE — H&P (Signed)
Primary Care Physician:  Rusty Aus, MD Primary Gastroenterologist:  Dr. Vira Agar  Pre-Procedure History & Physical: HPI:  Dominic Osborn is a 73 y.o. male is here for an colonoscopy.Done for personal history of colon polyps.   Past Medical History:  Diagnosis Date  . Anxiety   . Bleeding disorder ([place])   . Hypertension     Past Surgical History:  Procedure Laterality Date  . CARDIAC VALVE REPLACEMENT    . TONSILLECTOMY      Prior to Admission medications   Medication Sig Start Date End Date Taking? Authorizing Provider  aspirin 81 MG chewable tablet Chew 81 mg by mouth daily.   Yes [provider]  Calcium Carbonate-Vitamin D (CALCIUM HIGH POTENCY/VITAMIN D) 600-200 MG-UNIT TABS Take by mouth.   Yes [provider]  doxylamine, Sleep, (UNISOM) 25 MG tablet Take 25 mg by mouth at bedtime as needed.   Yes [provider]  magnesium oxide (MAG-OX) 400 MG tablet Take 500 mg by mouth daily.   Yes [provider]  metoprolol succinate (TOPROL-XL) 50 MG 24 hr tablet 25 mg.  04/21/14  Yes [provider]  Multiple Vitamin (MULTI-VITAMINS) TABS Take by mouth.   Yes [provider]  olmesartan-hydrochlorothiazide (BENICAR HCT) 40-12.5 MG tablet Take 1 tablet by mouth daily. 32/12.5mg    Yes [provider]  amiodarone (PACERONE) 200 MG tablet  05/07/18   [provider]  amLODipine (NORVASC) 5 MG tablet TAKE 1 TABLET ONE TIME DAILY 05/29/14   [provider]  amoxicillin (AMOXIL) 500 MG tablet Take 500 mg by mouth 2 (two) times daily.    [provider]  atenolol (TENORMIN) 25 MG tablet Take by mouth.     [provider]  cetirizine (ZYRTEC) 10 MG tablet Take by mouth.    [provider]  Cholecalciferol (D 2000) 2000 UNITS TABS Take by mouth.    [provider]  fluticasone (FLONASE) 50 MCG/ACT nasal spray Place into the nose.    [provider]  furosemide (LASIX)  40 MG tablet TK 1 T PO ONCE D 03/27/18   [provider]  Magnesium 300 MG CAPS Take by mouth.    [provider]  naproxen sodium (RA NAPROXEN SODIUM) 220 MG tablet Take by mouth.    [provider]  potassium chloride SA (K-DUR,KLOR-CON) 20 MEQ tablet TK 1 T PO ONCE D. TK WHILE ON LASIX 03/27/18   [provider]  simvastatin (ZOCOR) 10 MG tablet Take 10 mg by mouth daily at 6 PM.    [provider]  traZODone (DESYREL) 50 MG tablet  04/10/18   [provider]    Allergies as of 09/07/2018  . (No Known Allergies)    Family History  Problem Relation Age of Onset  . Bladder Cancer Neg Hx   . Prostate cancer Neg Hx   . Kidney cancer Neg Hx     Social History   Socioeconomic History  . Marital status: Married    Spouse name: Not on file  . Number of children: Not on file  . Years of education: Not on file  . Highest education level: Not on file  Occupational History  . Not on file  Social Needs  . Financial resource strain: Not on file  . Food insecurity    Worry: Not on file    Inability: Not on file  . Transportation needs    Medical: Not on file    Non-medical:  Not on file  Tobacco Use  . Smoking status: Never Smoker  . Smokeless tobacco: Never Used  Substance and Sexual Activity  . Alcohol use: Yes    Alcohol/week: 0.0 standard drinks    Comment: occasional  . Drug use: No  . Sexual activity: Not on file  Lifestyle  . Physical activity    Days per week: Not on file    Minutes per session: Not on file  . Stress: Not on file  Relationships  . Social Herbalist on phone: Not on file    Gets together: Not on file    Attends religious service: Not on file    Active member of club or organization: Not on file    Attends meetings of clubs or organizations: Not on file    Relationship status: Not on file  . Intimate partner violence    Fear of current or ex partner: Not on file    Emotionally  abused: Not on file    Physically abused: Not on file    Forced sexual activity: Not on file  Other Topics Concern  . Not on file  Social History Narrative  . Not on file    Review of Systems: See HPI, otherwise negative ROS  Physical Exam: BP (!) 147/92   Pulse 69   Temp 97.8 F (36.6 C) (Tympanic)   Resp 18   Ht 6\' 4"  (1.93 m)   Wt 96.2 kg   SpO2 100%   BMI 25.81 kg/m  General:   Alert,  pleasant and cooperative in NAD Head:  Normocephalic and atraumatic. Neck:  Supple; no masses or thyromegaly. Lungs:  Clear throughout to auscultation.    Heart:  Regular rate and rhythm. Abdomen:  Soft, nontender and nondistended. Normal bowel sounds, without guarding, and without rebound.   Neurologic:  Alert and  oriented x4;  grossly normal neurologically.  Impression/Plan: [name] is here for an colonoscopy to be performed for [facility] colon polyps.   Risks, benefits, limitations, and alternatives regarding  colonoscopy have been reviewed with the patient.  Questions have been answered.  All parties agreeable.   Gaylyn Cheers, MD  10/01/2018, 7:46 AM

## 2018-10-01 NOTE — Anesthesia Preprocedure Evaluation (Signed)
Anesthesia Evaluation  Patient identified by MRN, date of birth, ID band Patient awake    Reviewed: Allergy & Precautions, NPO status , Patient's Chart, lab work & pertinent test results, reviewed documented beta blocker date and time   History of Anesthesia Complications Negative for: history of anesthetic complications  Airway Mallampati: II       Dental   Pulmonary neg sleep apnea, neg COPD,           Cardiovascular hypertension, Pt. on medications and Pt. on home beta blockers (-) Past MI and (-) CHF (-) dysrhythmias + Valvular Problems/Murmurs (Aortic Valve replacement, Aortic aneurysm repair)      Neuro/Psych neg Seizures Anxiety    GI/Hepatic Neg liver ROS, neg GERD  ,  Endo/Other  neg diabetes  Renal/GU negative Renal ROS     Musculoskeletal   Abdominal   Peds  Hematology   Anesthesia Other Findings   Reproductive/Obstetrics                             Anesthesia Physical Anesthesia Plan  ASA: III  Anesthesia Plan: General   Post-op Pain Management:    Induction: Intravenous  PONV Risk Score and Plan: 2 and Propofol infusion and TIVA  Airway Management Planned: Nasal Cannula  Additional Equipment:   Intra-op Plan:   Post-operative Plan:   Informed Consent: I have reviewed the patients History and Physical, chart, labs and discussed the procedure including the risks, benefits and alternatives for the proposed anesthesia with the patient or authorized representative who has indicated his/her understanding and acceptance.       Plan Discussed with:   Anesthesia Plan Comments:         Anesthesia Quick Evaluation

## 2018-10-01 NOTE — Anesthesia Post-op Follow-up Note (Signed)
Anesthesia QCDR form completed.        

## 2018-10-01 NOTE — Op Note (Signed)
[facility] Gastroenterology Patient Name: Dominic Osborn Procedure Date: 10/01/2018 7:47 AM MRN: [id number] Account #: [id number] Date of Birth: Dec 27, 1945 Admit Type: Outpatient Age: 73 Room: [facility] ENDO ROOM 3 Gender: Male Note Status: Finalized Procedure:            Colonoscopy Indications:          High risk colon cancer surveillance: Personal history                        of colonic polyps Providers:            Manya Silvas, MD Referring MD:         Rusty Aus, MD (Referring MD) Medicines:            Propofol per Anesthesia Complications:        No immediate complications. Procedure:            Pre-Anesthesia Assessment:                       - After reviewing the risks and benefits, the patient                        was deemed in satisfactory condition to undergo the                        procedure.                       After obtaining informed consent, the colonoscope was                        passed under direct vision. Throughout the procedure,                        the patient's blood pressure, pulse, and oxygen                        saturations were monitored continuously. The                        Colonoscope was introduced through the anus and                        advanced to the the cecum, identified by appendiceal                        orifice and ileocecal valve. The colonoscopy was                        performed without difficulty. The patient tolerated the                        procedure well. The quality of the bowel preparation                        was excellent. Findings:      A few small-mouthed diverticula were found in the sigmoid colon and       descending colon.      Internal hemorrhoids were found during endoscopy. The hemorrhoids were       small and Grade I (internal hemorrhoids that do not  prolapse).      The exam was otherwise without abnormality.      A diminutive polyp was found in the proximal transverse  colon. The polyp       was sessile. The polyp was removed with a cold snare. Resection and       retrieval were complete. Impression:           - One diminutive polyp in the proximal transverse                        colon, removed with a jumbo cold forceps. Resected and                        retrieved.                       - Diverticulosis in the sigmoid colon and in the                        descending colon.                       - Internal hemorrhoids.                       - The examination was otherwise normal. Recommendation:       - Await pathology results. Manya Silvas, MD 10/01/2018 8:23:34 AM This report has been signed electronically. Number of Addenda: 0 Note Initiated On: 10/01/2018 7:47 AM Scope Withdrawal Time: 0 hours 15 minutes 44 seconds  Total Procedure Duration: 0 hours 24 minutes 3 seconds       [facility]

## 2018-10-01 NOTE — Transfer of Care (Signed)
Immediate Anesthesia Transfer of Care Note  Patient: SAHAND GOSCH  Procedure(s) Performed: COLONOSCOPY WITH PROPOFOL (N/A )  Patient Location: PACU  Anesthesia Type:General  Level of Consciousness: sedated  Airway & Oxygen Therapy: Patient Spontanous Breathing and Patient connected to nasal cannula oxygen  Post-op Assessment: Report given to RN and Post -op Vital signs reviewed and stable  Post vital signs: Reviewed and stable  Last Vitals:  Vitals Value Taken Time  BP    Temp    Pulse 57 10/01/18 0824  Resp 17 10/01/18 0824  SpO2 100 % 10/01/18 0824  Vitals shown include unvalidated device data.  Last Pain:  Vitals:   10/01/18 0823  TempSrc: (P) Tympanic         Complications: No apparent anesthesia complications

## 2018-10-02 ENCOUNTER — Encounter: Payer: Self-pay | Admitting: Unknown Physician Specialty

## 2018-10-02 LAB — SURGICAL PATHOLOGY

## 2018-10-04 DIAGNOSIS — Z952 Presence of prosthetic heart valve: Secondary | ICD-10-CM

## 2018-10-22 ENCOUNTER — Encounter: Payer: Self-pay | Admitting: *Deleted

## 2018-10-22 DIAGNOSIS — Z952 Presence of prosthetic heart valve: Secondary | ICD-10-CM

## 2018-10-22 NOTE — Progress Notes (Signed)
Cardiac Individual Treatment Plan  Patient Details  Name: Dominic Osborn MRN: [id number] Date of Birth: 1945/08/19 Referring Provider:     Cardiac Rehab from 05/17/2018 in [facility] Cardiac and Pulmonary Rehab  Referring Provider  [name]      Initial Encounter Date:    Cardiac Rehab from 05/17/2018 in [facility] Cardiac and Pulmonary Rehab  Date  05/17/18      Visit Diagnosis: S/P AVR (aortic valve replacement)  Patient's Home Medications on Admission:  Current Outpatient Medications:  .  amiodarone (PACERONE) 200 MG tablet, , Disp: , Rfl:  .  amLODipine (NORVASC) 5 MG tablet, TAKE 1 TABLET ONE TIME DAILY, Disp: , Rfl:  .  amoxicillin (AMOXIL) 500 MG tablet, Take 500 mg by mouth 2 (two) times daily., Disp: , Rfl:  .  aspirin 81 MG chewable tablet, Chew 81 mg by mouth daily., Disp: , Rfl:  .  atenolol (TENORMIN) 25 MG tablet, Take by mouth. , Disp: , Rfl:  .  Calcium Carbonate-Vitamin D (CALCIUM HIGH POTENCY/VITAMIN D) 600-200 MG-UNIT TABS, Take by mouth., Disp: , Rfl:  .  cetirizine (ZYRTEC) 10 MG tablet, Take by mouth., Disp: , Rfl:  .  Cholecalciferol (D 2000) 2000 UNITS TABS, Take by mouth., Disp: , Rfl:  .  doxylamine, Sleep, (UNISOM) 25 MG tablet, Take 25 mg by mouth at bedtime as needed., Disp: , Rfl:  .  fluticasone (FLONASE) 50 MCG/ACT nasal spray, Place into the nose., Disp: , Rfl:  .  furosemide (LASIX) 40 MG tablet, TK 1 T PO ONCE D, Disp: , Rfl:  .  Magnesium 300 MG CAPS, Take by mouth., Disp: , Rfl:  .  magnesium oxide (MAG-OX) 400 MG tablet, Take 500 mg by mouth daily., Disp: , Rfl:  .  metoprolol succinate (TOPROL-XL) 50 MG 24 hr tablet, 25 mg. , Disp: , Rfl:  .  Multiple Vitamin (MULTI-VITAMINS) TABS, Take by mouth., Disp: , Rfl:  .  naproxen sodium (RA NAPROXEN SODIUM) 220 MG tablet, Take by mouth., Disp: , Rfl:  .  olmesartan-hydrochlorothiazide (BENICAR HCT) 40-12.5 MG tablet, Take 1 tablet by mouth daily. 32/12.51m, Disp: , Rfl:  .  potassium chloride SA (K-DUR,KLOR-CON) 20  MEQ tablet, TK 1 T PO ONCE D. TK WHILE ON LASIX, Disp: , Rfl:  .  simvastatin (ZOCOR) 10 MG tablet, Take 10 mg by mouth daily at 6 PM., Disp: , Rfl:  .  traZODone (DESYREL) 50 MG tablet, , Disp: , Rfl:   Past Medical History: Past Medical History:  Diagnosis Date  . Anxiety   . Bleeding disorder ([place]   . Hypertension     Tobacco Use: Social History   Tobacco Use  Smoking Status Never Smoker  Smokeless Tobacco Never Used    Labs: Recent Review Flowsheet Data    There is no flowsheet data to display.       Exercise Target Goals: Exercise Program Goal: Individual exercise prescription set using results from initial 6 min walk test and THRR while considering  patient's activity barriers and safety.   Exercise Prescription Goal: Initial exercise prescription builds to 30-45 minutes a day of aerobic activity, 2-3 days per week.  Home exercise guidelines will be given to patient during program as part of exercise prescription that the participant will acknowledge.  Activity Barriers & Risk Stratification: Activity Barriers & Cardiac Risk Stratification - 05/17/18 1435      Activity Barriers & Cardiac Risk Stratification   Activity Barriers  None    Cardiac Risk Stratification  High       6 Minute Walk: 6 Minute Walk    Row Name 05/17/18 1445         6 Minute Walk   Phase  Initial     Distance  1900 feet     Walk Time  6 minutes     # of Rest Breaks  0     MPH  3.6     METS  4.72     RPE  13     Perceived Dyspnea   0     VO2 Peak  16.5     Symptoms  No     Resting HR  70 bpm     Resting BP  142/56     Resting Oxygen Saturation   98 %     Max Ex. HR  99 bpm     Max Ex. BP  218/70     2 Minute Post BP  178/68        Oxygen Initial Assessment:   Oxygen Re-Evaluation:   Oxygen Discharge (Final Oxygen Re-Evaluation):   Initial Exercise Prescription: Initial Exercise Prescription - 05/17/18 1400      Date of Initial Exercise RX and Referring  Provider   Date  05/17/18    Referring Provider  [name]      Treadmill   MPH  3.6    Grade  1.5    Minutes  15    METs  4.5      Recumbant Bike   Level  8    RPM  60    Watts  85    Minutes  15    METs  4.5      Arm Ergometer   Level  3    RPM  40    Minutes  15    METs  4.5      Prescription Details   Frequency (times per week)  3    Duration  Progress to 30 minutes of continuous aerobic without signs/symptoms of physical distress      Intensity   THRR 40-80% of Max Heartrate  102-133    Ratings of Perceived Exertion  11-15    Perceived Dyspnea  0-4      Resistance Training   Training Prescription  Yes    Weight  4    Reps  10-15       Perform Capillary Blood Glucose checks as needed.  Exercise Prescription Changes: Exercise Prescription Changes    Row Name 05/17/18 1400 06/06/18 1500 06/07/18 1100 06/20/18 1400 07/04/18 1000     Response to Exercise   Blood Pressure (Admit)  142/56  128/88  -  112/60  132/64   Blood Pressure (Exercise)  218/70  186/86  -  150/90  164/72   Blood Pressure (Exit)  178/68  162/88  -  142/78  124/60   Heart Rate (Admit)  64 bpm  72 bpm  -  66 bpm  59 bpm   Heart Rate (Exercise)  99 bpm  99 bpm  -  95 bpm  92 bpm   Heart Rate (Exit)  81 bpm  77 bpm  -  65 bpm  67 bpm   Oxygen Saturation (Admit)  98 %  -  -  -  -   Rating of Perceived Exertion (Exercise)  -  13  -  14  14   Symptoms  -  none  -  none  none   Duration  -  Continue with 30 min of aerobic exercise without signs/symptoms of physical distress.  -  Continue with 30 min of aerobic exercise without signs/symptoms of physical distress.  Continue with 30 min of aerobic exercise without signs/symptoms of physical distress.   Intensity  -  THRR unchanged  -  THRR unchanged  THRR unchanged     Progression   Progression  -  Continue to progress workloads to maintain intensity without signs/symptoms of physical distress.  -  Continue to progress workloads to maintain intensity  without signs/symptoms of physical distress.  Continue to progress workloads to maintain intensity without signs/symptoms of physical distress.   Average METs  -  3.24  -  3.31  3.62     Resistance Training   Training Prescription  -  Yes  -  Yes  Yes   Weight  -  4 lbs  -  4 lbs  4 lbs   Reps  -  10-15  -  10-15  10-15     Interval Training   Interval Training  -  No  -  No  No     Treadmill   MPH  -  3.5  -  3.5  3.5   Grade  -  1  -  1  1   Minutes  -  15  -  15  15   METs  -  4.16  -  4.16  4.16     Recumbant Bike   Level  -  4  -  4  8   Watts  -  40  -  47  60   Minutes  -  15  -  15  15   METs  -  3.56  -  -  3.89     Arm Ergometer   Level  -  2  -  2  2   Minutes  -  15  -  15  15   METs  -  2.3  -  2.2  2.8     Home Exercise Plan   Plans to continue exercise at  -  -  [employer] (comment) [employer] in [place], push ups, sit ups  [employer] (comment) [employer] in [place], push ups, sit ups  [employer] (comment) [employer] in [place], push ups, sit ups   Frequency  -  -  Add 3 additional days to program exercise sessions.  Add 3 additional days to program exercise sessions.  Add 3 additional days to program exercise sessions.   Initial Home Exercises Provided  -  -  06/07/18  06/07/18  06/07/18      Exercise Comments: Exercise Comments    Row Name 05/29/18 0915           Exercise Comments  First full day of exercise!  Patient was oriented to gym and equipment including functions, settings, policies, and procedures.  Patient's individual exercise prescription and treatment plan were reviewed.  All starting workloads were established based on the results of the 6 minute walk test done at initial orientation visit.  The plan for exercise progression was also introduced and progression will be customized based on patient's performance and goals.          Exercise Goals and Review: Exercise Goals    Row Name 05/17/18 1444              Exercise Goals   Increase Physical Activity  Yes  Intervention  Provide advice, education, support and counseling about physical activity/exercise needs.;Develop an individualized exercise prescription for aerobic and resistive training based on initial evaluation findings, risk stratification, comorbidities and participant's personal goals.       Expected Outcomes  Short Term: Attend rehab on a regular basis to increase amount of physical activity.;Long Term: Add in home exercise to make exercise part of routine and to increase amount of physical activity.;Long Term: Exercising regularly at least 3-5 days a week.       Increase Strength and Stamina  Yes       Intervention  Provide advice, education, support and counseling about physical activity/exercise needs.;Develop an individualized exercise prescription for aerobic and resistive training based on initial evaluation findings, risk stratification, comorbidities and participant's personal goals.       Expected Outcomes  Short Term: Increase workloads from initial exercise prescription for resistance, speed, and METs.;Short Term: Perform resistance training exercises routinely during rehab and add in resistance training at home;Long Term: Improve cardiorespiratory fitness, muscular endurance and strength as measured by increased METs and functional capacity (6MWT)       Able to understand and use rate of perceived exertion (RPE) scale  Yes       Intervention  Provide education and explanation on how to use RPE scale       Expected Outcomes  Short Term: Able to use RPE daily in rehab to express subjective intensity level;Long Term:  Able to use RPE to guide intensity level when exercising independently       Able to understand and use Dyspnea scale  Yes       Intervention  Provide education and explanation on how to use Dyspnea scale       Expected Outcomes  Short Term: Able to use Dyspnea scale daily in rehab to express  subjective sense of shortness of breath during exertion;Long Term: Able to use Dyspnea scale to guide intensity level when exercising independently       Knowledge and understanding of Target Heart Rate Range (THRR)  Yes       Intervention  Provide education and explanation of THRR including how the numbers were predicted and where they are located for reference       Expected Outcomes  Short Term: Able to state/look up THRR;Short Term: Able to use daily as guideline for intensity in rehab;Long Term: Able to use THRR to govern intensity when exercising independently       Able to check pulse independently  Yes       Intervention  Review the importance of being able to check your own pulse for safety during independent exercise;Provide education and demonstration on how to check pulse in carotid and radial arteries.       Expected Outcomes  Short Term: Able to explain why pulse checking is important during independent exercise;Long Term: Able to check pulse independently and accurately       Understanding of Exercise Prescription  Yes       Intervention  Provide education, explanation, and written materials on patient's individual exercise prescription       Expected Outcomes  Short Term: Able to explain program exercise prescription;Long Term: Able to explain home exercise prescription to exercise independently          Exercise Goals Re-Evaluation : Exercise Goals Re-Evaluation    Row Name 05/29/18 0915 06/06/18 0955 06/06/18 1515 06/07/18 1103 06/20/18 1353     Exercise Goal Re-Evaluation   Exercise Goals Review  Increase Physical Activity;Increase Strength and Stamina;Able to understand and use rate of perceived exertion (RPE) scale;Knowledge and understanding of Target Heart Rate Range (THRR);Understanding of Exercise Prescription  Increase Physical Activity;Increase Strength and Stamina;Understanding of Exercise Prescription  (Pended)   Increase Physical Activity;Increase Strength and  Stamina;Understanding of Exercise Prescription  Increase Physical Activity;Increase Strength and Stamina;Understanding of Exercise Prescription;Able to understand and use rate of perceived exertion (RPE) scale;Able to understand and use Dyspnea scale;Knowledge and understanding of Target Heart Rate Range (THRR);Able to check pulse independently  Increase Physical Activity;Increase Strength and Stamina;Understanding of Exercise Prescription   Comments  Reviewed RPE scale, THR and program prescription with pt today.  Pt voiced understanding and was given a copy of goals to take home.   Ryver is off   (Pended)   Dreyton is off to a good start in rehab.  He has already at 40 watts on the bike.  We will continue to monitor his progress.   Reviewed home exercise with pt today.  Pt plans to walk and continue with [employer] class at [facility] for exercise.  He is also planning to return to his sit ups and push ups.  Reviewed THR, pulse, RPE, sign and symptoms, NTG use, and when to call 911 or MD.  Also discussed weather considerations and indoor options.  Pt voiced understanding.  Sullivan has been doing well in rehab.  He missed one day last week due to  UTI and was surprised that he felt pretty good coming back to class yesterday.  He is up to 47 watts on the recumbent bike.  We will continue to monitor his progress.    Expected Outcomes  Short: Use RPE daily to regulate intensity. Long: Follow program prescription in THR.  -  Short: Increase workloads and review home exercise guidelines.  Long: Continue to increase strength and stamina.   Short: Continue with exercise in [employer] and add back in push ups and sit ups.  Long: Continue to increase strength and stamina.   Short: Continue to increase workloads.  Long: Continue to improve strength and stamina.    [place] Name 06/26/18 6387 07/04/18 1034 07/19/18 1035 08/09/18 1141 08/30/18 1143     Exercise Goal Re-Evaluation   Exercise Goals Review  Increase Physical  Activity;Increase Strength and Stamina;Understanding of Exercise Prescription  Increase Physical Activity;Increase Strength and Stamina;Understanding of Exercise Prescription  Increase Physical Activity;Increase Strength and Stamina;Understanding of Exercise Prescription  Increase Physical Activity;Increase Strength and Stamina;Understanding of Exercise Prescription  Increase Physical Activity;Increase Strength and Stamina;Understanding of Exercise Prescription   Comments  Kayle is doing well in rehab.  He is doing his home exercise some and rebuilding his push ups back to presurgery levels.  He is losing weight some.  He has been able to mow his lawn and his regained his strength and stamina.   Corday continues to do well in rehab.  He is now up to 60 watts on the bike.  He is moving at home and doing yard work.  He is going to walk and do some of the videos that we will be sending out. We will continue to monitor his progress at home.   Kamil is doing well. He is exercising at home each day!  He is aiming for 30 min each day.  He has been watching our videos and enjoys seeing familiar faces.  He mentioned that his wife has even begun to notice differences in his stamina.  He has been able to get  outside and work in the yard again.  He feels better now than he did before his surgery!  Javid has been doing well with his exericse at home.  He continues to walk at least 30 min each time.  He has also been able to get out more to work in the yard!  He enjoys being outside again. He feels like his strength and stamina are better now than they were before his surgery!!  Jearld continues to get in his walks daily at home.  He finds that it helps him with his stress levels too.  He is looking forward to getting back to class.  He has enjoyed our videos as well.    Expected Outcomes  Short: Continue to recover from UTI  Long: Continue to rebuild push ups.   Short: Continue to walk at home.  Long; Continue to build strength.    Short: Contiue to walk daily.  Long: Continue to rebuild his strength back up.   Short: Continue to walk and exercise daily!  Long: Continue to build strength and stamina.   Short: Continue to walk.  Long: Continue to rebuild his stamina.    [place] Name 09/24/18 1307             Exercise Goal Re-Evaluation   Exercise Goals Review  Increase Physical Activity;Increase Strength and Stamina;Understanding of Exercise Prescription       Comments  Nyshaun continues to walk daily. He also continues to use the videos on occasion. He is feeling pretty good overall, but still eager to get back to class.        Expected Outcomes  Short: Continue to walk.  Long: Continue to rebuild his stamina.           Discharge Exercise Prescription (Final Exercise Prescription Changes): Exercise Prescription Changes - 07/04/18 1000      Response to Exercise   Blood Pressure (Admit)  132/64    Blood Pressure (Exercise)  164/72    Blood Pressure (Exit)  124/60    Heart Rate (Admit)  59 bpm    Heart Rate (Exercise)  92 bpm    Heart Rate (Exit)  67 bpm    Rating of Perceived Exertion (Exercise)  14    Symptoms  none    Duration  Continue with 30 min of aerobic exercise without signs/symptoms of physical distress.    Intensity  THRR unchanged      Progression   Progression  Continue to progress workloads to maintain intensity without signs/symptoms of physical distress.    Average METs  3.62      Resistance Training   Training Prescription  Yes    Weight  4 lbs    Reps  10-15      Interval Training   Interval Training  No      Treadmill   MPH  3.5    Grade  1    Minutes  15    METs  4.16      Recumbant Bike   Level  8    Watts  60    Minutes  15    METs  3.89      Arm Ergometer   Level  2    Minutes  15    METs  2.8      Home Exercise Plan   Plans to continue exercise at  [employer] (comment)   [employer] in [place], push ups, sit ups   Frequency  Add 3 additional  days to  program exercise sessions.    Initial Home Exercises Provided  06/07/18       Nutrition:  Target Goals: Understanding of nutrition guidelines, daily intake of sodium <1580m, cholesterol <2057m calories 30% from fat and 7% or less from saturated fats, daily to have 5 or more servings of fruits and vegetables.  Biometrics: Pre Biometrics - 05/17/18 1443      Pre Biometrics   Height  6' 3.75" (1.924 m)    Weight  215 lb 8 oz (97.8 kg)    Waist Circumference  39.5 inches    Hip Circumference  44 inches    Waist to Hip Ratio  0.9 %    BMI (Calculated)  26.41    Single Leg Stand  30 seconds        Nutrition Therapy Plan and Nutrition Goals: Nutrition Therapy & Goals - 07/12/18 1349      Nutrition Therapy   Diet  Heart Healthy Low Sodium Diet 2000-2200kcal (BMI: 26.4)(wst: 39.5 inches)    Protein (specify units)  80-90g    Fiber  30 grams    Whole Grain Foods  3 servings    Saturated Fats  12 max. grams    Fruits and Vegetables  5 servings/day    Sodium  1.5 grams      Personal Nutrition Goals   Nutrition Goal  ST: monitor sodium intake LT: maintain weight (212-215lbs)    Comments  Pt reports wanting to keep his weight where it is, doesn't track Na much, limited whole grain intake (will eat sometimes). Ptreports eating cereal with 2% milk, bagel/english muffin, coffee(decaf with splenda), L: subway (black forest ham with veggies, light o/v and s/p, itNew Zealanderb cheese 6 inch) or takeout salad. Dinner: "proper vegetables and small meat". Pt just went for brisk walk reports eating a banana and has incorporated f/v into his diet well. Discussed the importanc eof whole grains and salt, no changes ready to be made, discussed giving him more info on how much Na is in the subway sandwich.       Intervention Plan   Intervention  Prescribe, educate and counsel regarding individualized specific dietary modifications aiming towards targeted core components such as weight, hypertension, lipid  management, diabetes, heart failure and other comorbidities.    Expected Outcomes  Short Term Goal: Understand basic principles of dietary content, such as calories, fat, sodium, cholesterol and nutrients.;Short Term Goal: A plan has been developed with personal nutrition goals set during dietitian appointment.;Long Term Goal: Adherence to prescribed nutrition plan.       Nutrition Assessments: Nutrition Assessments - 05/17/18 1419      MEDFICTS Scores   Pre Score  74       Nutrition Goals Re-Evaluation: Nutrition Goals Re-Evaluation    Row Name 06/07/18 1105 06/26/18 1000 10/04/18 1144         Goals   Nutrition Goal  Heart Healthy Diet, Low Sodium  Heart Healthy Diet, Low Sodium  ST: continue to minimize salt intake [id number] manage BP and maintain weight     Comment  DaDarreks doing well with his diet.  His wife has some health problems as well so they were already following a healthy diet.  They eat a lot of fruits and vegetables and lean meats.  They have not been watching their salt intake.  We talked about how watching their salt could help improve Desmond's blood pressures readings.  We will contnue to watch his pressures.  Eldo is still losing weight and his appetite is still good.  He tries to balance his meals for bigger ones with smaller meals.  He continuus to try to get his fruits and vegetables.  He has started to notice the salt a little more and relies on his wife to help with that.    Minimized sodium intake working with Dr [name]. Discussed with him that his subway sandwich was 1022m of Na (looked up from last we spoke per request). Pt reports wanting to continue with his diet and that he is happy with thte way that it is and he continues to exercise and is doing well.     Expected Outcome  Short: Start watching sodium intake more closely.  Long: Continue to eat healthy.   Short: Continue to watch sodium.  Long: Continue with healthy eating.   maintain weight, continue to  manage BP and HH diet        Nutrition Goals Discharge (Final Nutrition Goals Re-Evaluation): Nutrition Goals Re-Evaluation - 10/04/18 1144      Goals   Nutrition Goal  ST: continue to minimize salt intake LJJ:[id number] manage BP and maintain weight    Comment  Minimized sodium intake working with Dr MSabra Heck Discussed with him that his subway sandwich was 10487mof Na (looked up from last we spoke per request). Pt reports wanting to continue with his diet and that he is happy with thte way that it is and he continues to exercise and is doing well.    Expected Outcome  maintain weight, continue to manage BP and HH diet       Psychosocial: Target Goals: Acknowledge presence or absence of significant depression and/or stress, maximize coping skills, provide positive support system. Participant is able to verbalize types and ability to use techniques and skills needed for reducing stress and depression.   Initial Review & Psychosocial Screening: Initial Psych Review & Screening - 05/17/18 1412      Initial Review   Current issues with  Current Sleep Concerns;Current Stress Concerns    Source of Stress Concerns  Family    Comments  Wife has a neurological disease ("cousin to MS") and COPD. She is also experiencing some memory impairment. He reports this being very difficult on him, as his wife's health is his priority. He did schedule this survery of his AV replacement around his yard duties around the house. He is very organized and enjoys staying physically active. His wife's health is hard for him because he doesn't know what all he can do to help her.       Family Dynamics   Good Support System?  Yes   spouse, family, friends     Barriers   Psychosocial barriers to participate in program  There are no identifiable barriers or psychosocial needs.;The patient should benefit from training in stress management and relaxation.      Screening Interventions   Interventions  Encouraged to  exercise;Program counselor consult;To provide support and resources with identified psychosocial needs;Provide feedback about the scores to participant    Expected Outcomes  Short Term goal: Utilizing psychosocial counselor, staff and physician to assist with identification of specific Stressors or current issues interfering with healing process. Setting desired goal for each stressor or current issue identified.;Long Term Goal: Stressors or current issues are controlled or eliminated.;Short Term goal: Identification and review with participant of any Quality of Life or Depression concerns found by scoring the questionnaire.;Long Term goal: The participant improves quality  of Life and PHQ9 Scores as seen by post scores and/or verbalization of changes       Quality of Life Scores:  Quality of Life - 05/17/18 1415      Quality of Life   [facility]  Quality of Life      Quality of Life Scores   Health/Function Pre  27.37 %    Socioeconomic Pre  25.71 %    Psych/Spiritual Pre  28.93 %    Family Pre  27.6 %    GLOBAL Pre  27.38 %      Scores of 19 and below usually indicate a poorer quality of life in these areas.  A difference of  2-3 points is a clinically meaningful difference.  A difference of 2-3 points in the total score of the Quality of Life Index has been associated with significant improvement in overall quality of life, self-image, physical symptoms, and general health in studies assessing change in quality of life.  PHQ-9: Recent Review Flowsheet Data    Depression screen [facility] 2/9 05/17/2018   Decreased Interest 0   Down, Depressed, Hopeless 0   PHQ - 2 Score 0   Altered sleeping 0   Tired, decreased energy 0   Change in appetite 0   Feeling bad or failure about yourself  0   Trouble concentrating 0   Moving slowly or fidgety/restless 0   Suicidal thoughts 0   PHQ-9 Score 0     Interpretation of Total Score  Total Score Depression Severity:  1-4 = Minimal depression, 5-9 =  Mild depression, 10-14 = Moderate depression, 15-19 = Moderately severe depression, 20-27 = Severe depression   Psychosocial Evaluation and Intervention: Psychosocial Evaluation - 06/06/18 1001      Psychosocial Evaluation & Interventions   Interventions  Stress management education;Encouraged to exercise with the program and follow exercise prescription    Comments  Counselor met with Mr. Gee [place]) today for initial psychosocial evaluation.  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  Shelia has a strong support system with a spouse of 87 years; a daughter locally; a sister-in-law and active involvement in his local church.  He reports being in fairly good health other than his heart.  He sleeps well and has appetite.  Garison reports some history of anxiety as he retired at the age of 10 and has been on multiple international mission trips; but the one that impacted him the most was after 9/11 and he began noticing the impact of this trauma in his own life.  He is typically in a positive mood.  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  Juelz has goals to be more heart healthy and to manage stress in his life.  He will be followed by staff.    Expected Outcomes  Short:  Elias participated in the stress management educational class today and is encouraged to practice better self-care for his stress and his health.  Long:  Heath will begin a healthier lifestyle with diet/exercise and stress management for his health and mental health.      Continue Psychosocial Services   Follow up required by staff       Psychosocial Re-Evaluation: Psychosocial Re-Evaluation    [place] Name 06/26/18 [phone] 07/19/18 1046 08/09/18 1143 08/30/18 1144 09/24/18 1317     Psychosocial Re-Evaluation  Current issues with  Current Stress Concerns;Current Sleep Concerns  Current Stress  Concerns;Current Sleep Concerns  Current Stress Concerns;Current Sleep Concerns  Current Stress Concerns;Current Sleep Concerns  Current Stress Concerns;Current Sleep Concerns   Comments  Odell's  UTI has kept him up more at night going to bathroom so he has not been sleeping as well.  His health continues to be a major stress.  He is his wife's primary caregiver and his brother has ALS and continues to decline.  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  They were able to visit last week.  He tries to stay postive and keep things moving. He has a follow up appointment today with Dr. [name].   Shaheed is doing well mentally. He is back to doing more of his normal acitivites and able to get outside to work more now.  He continues to care for his wife and brother, but they have noticed his improved stamina.  He is doing his best to keep his family safe and healthy given their higher risk during this time.  That is currently his biggest stressor.  However, he is sleeping better finally!  He noted that is he now "sleeping like a baby".  He is so excited to be sleeping again and has renewed energy.!  Makoto is doing well mentally. He is working in the yard and feeling better now than before his surgery.  He continues to sleep better.  Today, he has a little more stress.  He had just dropped his wife off at the hospital for a bronchoscopy to help with her battle of coughing for the past 6 months.  He is hoping she will feel better after this.   Zekiah is sleeping well, but has a lot of stress currently.  His wife is still have trouble breathing even with a new expensive drug that they tired.  Insurance does not cover the cost as it is experimental, but it is not seeming to work too well currently.  Also, the nursing home where his brother lives now has a COVID-55 outbreak.  So far, his brother is safe, but he said that he is just waiting for that call to come.  He is doing his best to stay postive and exercises to help  cope.   Zahi is doing okay.  When I first call, his wife was having oxygen for home use being delivered.  This was hard on them as it makes harder to deny her decline, but they hope it will at least improve her quality of life.  His brother has been doing well despite having tested positive. He had not shown symptoms thus far.  The facility had 97 positive cases out of 150 staff and patients. He is continues to try to stay postivie and uses his walks for stress relief.    Expected Outcomes  Short: Meet with doctor about UTI and surgical follow up.  Long: Continue to recover sleep and stay postive.   Short: Continue to stay more active to maintain stamina.  Long: Continue to sleep better and stay postive.   Short: Continue to stay active.  Long: Continue to stay positive and cope with wife's illness.   Short: Continue to stay active and hopeful.  Long: Continue to cope well.   Short: Continueto stay connected with brother. Long: Continue to cope and practive self care.    Interventions  Stress management education;Encouraged to attend Cardiac Rehabilitation for the exercise  Stress management education;Encouraged to attend  Cardiac Rehabilitation for the exercise  Stress management education;Encouraged to attend Cardiac Rehabilitation for the exercise  Stress management education;Encouraged to attend Cardiac Rehabilitation for the exercise  -   Continue Psychosocial Services   Follow up required by staff  Follow up required by staff  Follow up required by staff  Follow up required by staff  Follow up required by staff      Psychosocial Discharge (Final Psychosocial Re-Evaluation): Psychosocial Re-Evaluation - 09/24/18 1317      Psychosocial Re-Evaluation   Current issues with  Current Stress Concerns;Current Sleep Concerns    Comments  Aldair is doing okay.  When I first call, his wife was having oxygen for home use being delivered.  This was hard on them as it makes harder to deny her decline, but they  hope it will at least improve her quality of life.  His brother has been doing well despite having tested positive. He had not shown symptoms thus far.  The facility had 97 positive cases out of 150 staff and patients. He is continues to try to stay postivie and uses his walks for stress relief.     Expected Outcomes  Short: Continueto stay connected with brother. Long: Continue to cope and practive self care.     Continue Psychosocial Services   Follow up required by staff       Vocational Rehabilitation: Provide vocational rehab assistance to qualifying candidates.   Vocational Rehab Evaluation & Intervention: Vocational Rehab - 05/17/18 1412      Initial Vocational Rehab Evaluation & Intervention   Assessment shows need for Vocational Rehabilitation  No       Education: Education Goals: Education classes will be provided on a variety of topics geared toward better understanding of heart health and risk factor modification. Participant will state understanding/return demonstration of topics presented as noted by education test scores.  Learning Barriers/Preferences: Learning Barriers/Preferences - 05/17/18 1411      Learning Barriers/Preferences   Learning Barriers  None    Learning Preferences  None       Education Topics:  AED/CPR: - Group verbal and written instruction with the use of models to demonstrate the basic use of the AED with the basic ABC's of resuscitation.   Cardiac Rehab from 06/28/2018 in [facility] Cardiac and Pulmonary Rehab  Date  06/12/18  Educator  SB  Instruction Review Code  1- Verbalizes Understanding      General Nutrition Guidelines/Fats and Fiber: -Group instruction provided by verbal, written material, models and posters to present the general guidelines for heart healthy nutrition. Gives an explanation and review of dietary fats and fiber.   Cardiac Rehab from 06/28/2018 in [facility] Cardiac and Pulmonary Rehab  Date  06/26/18  Educator  [facility]   Instruction Review Code  1- Verbalizes Understanding      Controlling Sodium/Reading Food Labels: -Group verbal and written material supporting the discussion of sodium use in heart healthy nutrition. Review and explanation with models, verbal and written materials for utilization of the food label.   Cardiac Rehab from 06/28/2018 in [facility] Cardiac and Pulmonary Rehab  Date  06/28/18  Educator  [facility]  Instruction Review Code  1- Verbalizes Understanding      Exercise Physiology & General Exercise Guidelines: - Group verbal and written instruction with models to review the exercise physiology of the cardiovascular system and associated critical values. Provides general exercise guidelines with specific guidelines to those with heart or lung disease.    Aerobic Exercise &  Resistance Training: - Gives group verbal and written instruction on the various components of exercise. Focuses on aerobic and resistive training programs and the benefits of this training and how to safely progress through these programs..   Flexibility, Balance, Mind/Body Relaxation: Provides group verbal/written instruction on the benefits of flexibility and balance training, including mind/body exercise modes such as yoga, pilates and tai chi.  Demonstration and skill practice provided.   Stress and Anxiety: - Provides group verbal and written instruction about the health risks of elevated stress and causes of high stress.  Discuss the correlation between heart/lung disease and anxiety and treatment options. Review healthy ways to manage with stress and anxiety.   Cardiac Rehab from 06/28/2018 in [facility] Cardiac and Pulmonary Rehab  Date  06/05/18  Educator  [facility]  Instruction Review Code  1- Verbalizes Understanding      Depression: - Provides group verbal and written instruction on the correlation between heart/lung disease and depressed mood, treatment options, and the stigmas associated with seeking treatment.    Cardiac Rehab from 06/28/2018 in [facility] Cardiac and Pulmonary Rehab  Date  06/19/18  Educator  [facility]  Instruction Review Code  1- Verbalizes Understanding      Anatomy & Physiology of the Heart: - Group verbal and written instruction and models provide basic cardiac anatomy and physiology, with the coronary electrical and arterial systems. Review of Valvular disease and Heart Failure   Cardiac Procedures: - Group verbal and written instruction to review commonly prescribed medications for heart disease. Reviews the medication, class of the drug, and side effects. Includes the steps to properly store meds and maintain the prescription regimen. (beta blockers and nitrates)   Cardiac Rehab from 06/28/2018 in [facility] Cardiac and Pulmonary Rehab  Date  06/07/18  Educator  CE  Instruction Review Code  1- Verbalizes Understanding      Cardiac Medications I: - Group verbal and written instruction to review commonly prescribed medications for heart disease. Reviews the medication, class of the drug, and side effects. Includes the steps to properly store meds and maintain the prescription regimen.   Cardiac Rehab from 06/28/2018 in [facility] Cardiac and Pulmonary Rehab  Date  05/29/18  Educator  SB  Instruction Review Code  1- Verbalizes Understanding      Cardiac Medications II: -Group verbal and written instruction to review commonly prescribed medications for heart disease. Reviews the medication, class of the drug, and side effects. (all other drug classes)   Cardiac Rehab from 06/28/2018 in [facility] Cardiac and Pulmonary Rehab  Date  06/21/18  Educator  CE  Instruction Review Code  1- Verbalizes Understanding       Go Sex-Intimacy & Heart Disease, Get SMART - Goal Setting: - Group verbal and written instruction through game format to discuss heart disease and the return to sexual intimacy. Provides group verbal and written material to discuss and apply goal setting through the application of the  S.M.A.R.T. Method.   Cardiac Rehab from 06/28/2018 in [facility] Cardiac and Pulmonary Rehab  Date  06/07/18  Educator  CE  Instruction Review Code  1- Verbalizes Understanding      Other Matters of the Heart: - Provides group verbal, written materials and models to describe Stable Angina and Peripheral Artery. Includes description of the disease process and treatment options available to the cardiac patient.   Exercise & Equipment Safety: - Individual verbal instruction and demonstration of equipment use and safety with use of the equipment.   Cardiac Rehab from 06/28/2018  in [facility] Cardiac and Pulmonary Rehab  Date  05/17/18  Educator  [facility]  Instruction Review Code  1- Verbalizes Understanding      Infection Prevention: - Provides verbal and written material to individual with discussion of infection control including proper hand washing and proper equipment cleaning during exercise session.   Cardiac Rehab from 06/28/2018 in [facility] Cardiac and Pulmonary Rehab  Date  05/17/18  Educator  [facility]  Instruction Review Code  1- Verbalizes Understanding      Falls Prevention: - Provides verbal and written material to individual with discussion of falls prevention and safety.   Cardiac Rehab from 06/28/2018 in [facility] Cardiac and Pulmonary Rehab  Date  05/17/18  Educator  [facility]  Instruction Review Code  1- Verbalizes Understanding      Diabetes: - Individual verbal and written instruction to review signs/symptoms of diabetes, desired ranges of glucose level fasting, after meals and with exercise. Acknowledge that pre and post exercise glucose checks will be done for 3 sessions at entry of program.   Know Your Numbers and Risk Factors: -Group verbal and written instruction about important numbers in your health.  Discussion of what are risk factors and how they play a role in the disease process.  Review of Cholesterol, Blood Pressure, Diabetes, and BMI and the role they play in your overall health.    Cardiac Rehab from 06/28/2018 in [facility] Cardiac and Pulmonary Rehab  Date  06/21/18  Educator  CE  Instruction Review Code  1- Verbalizes Understanding      Sleep Hygiene: -Provides group verbal and written instruction about how sleep can affect your health.  Define sleep hygiene, discuss sleep cycles and impact of sleep habits. Review good sleep hygiene tips.    Other: -Provides group and verbal instruction on various topics (see comments)   Knowledge Questionnaire Score: Knowledge Questionnaire Score - 05/17/18 1411      Knowledge Questionnaire Score   Pre Score  24/26   correct answers reviewed with Shanon Brow. Focus on exercise      Core Components/Risk Factors/Patient Goals at Admission: Personal Goals and Risk Factors at Admission - 05/17/18 1409      Core Components/Risk Factors/Patient Goals on Admission    Weight Management  Yes;Weight Maintenance    Intervention  Weight Management: Develop a combined nutrition and exercise program designed to reach desired caloric intake, while maintaining appropriate intake of nutrient and fiber, sodium and fats, and appropriate energy expenditure required for the weight goal.;Weight Management: Provide education and appropriate resources to help participant work on and attain dietary goals.    Admit Weight  215 lb 8 oz (97.8 kg)    Expected Outcomes  Short Term: Continue to assess and modify interventions until short term weight is achieved;Long Term: Adherence to nutrition and physical activity/exercise program aimed toward attainment of established weight goal;Weight Maintenance: Understanding of the daily nutrition guidelines, which includes 25-35% calories from fat, 7% or less cal from saturated fats, less than 233m cholesterol, less than 1.5gm of sodium, & 5 or more servings of fruits and vegetables daily;Understanding recommendations for meals to include 15-35% energy as protein, 25-35% energy from fat, 35-60% energy from carbohydrates, less  than 20103mof dietary cholesterol, 20-35 gm of total fiber daily;Understanding of distribution of calorie intake throughout the day with the consumption of 4-5 meals/snacks    Hypertension  Yes    Intervention  Provide education on lifestyle modifcations including regular physical activity/exercise, weight management, moderate sodium restriction and increased consumption of  fresh fruit, vegetables, and low fat dairy, alcohol moderation, and smoking cessation.;Monitor prescription use compliance.    Expected Outcomes  Short Term: Continued assessment and intervention until BP is < 140/62m HG in hypertensive participants. < 130/870mHG in hypertensive participants with diabetes, heart failure or chronic kidney disease.;Long Term: Maintenance of blood pressure at goal levels.       Core Components/Risk Factors/Patient Goals Review:  Goals and Risk Factor Review    Row Name 06/07/18 1109 06/26/18 1004 07/19/18 1049 08/09/18 1148 08/30/18 1147     Core Components/Risk Factors/Patient Goals Review   Personal Goals Review  Weight Management/Obesity;Lipids;Hypertension  Weight Management/Obesity;Lipids;Hypertension  Weight Management/Obesity;Lipids;Hypertension  Weight Management/Obesity;Lipids;Hypertension  Weight Management/Obesity;Lipids;Hypertension   Review  DaMerlandas been doing well in rehab. His weight has been steady and he weighs daily.  His blood pressures are doing better.  They were high his first day, but have since evened back out. He does check them daily at home along with his weight.  We will continue to keep a close eye on his pressures.    DaAkbarontinues to lose weight despite regaining his appetite.  He has a follow appointment today for his UTI and surgery.  He has not been checking his pressures much at home, but has not noticed them feeling high.  He has a wrist cuff at home.  We talked about taking it more frequently and keeping a record of it.  He is doing well with his medications  and just updated his list last week. He is planning to talk to doctor about adding back in his supplements and dropping his statin.   DaTriptonas been doing well at home.  His weight is staying steady and his pressures have been good. Overall, he feels like he is back to his normal!  DaMuazontinues to do well at home.  His weight is staying steady around 214 lbs, which he is very happy at.  He has not checked his blood pressure this week, but says he will get back into that routine.  Overall, things are going well for him and he is eager to get back to class.   DaGareds doing well.  His weight continues to stay steady.  He noted that his blood pressure has been up, partly due to increased stress at home.  He called Dr. MiSabra Hecknd was started on Benicar.  He will continue to keep a close eye on it.    Expected Outcomes  Short: Continue to weigh daily and watch blood pressures. Long: Continue to monitor risk factors.   Short: Talk to doctor about medications.  Start checking blood pressures again.  Long: Continue to monitor risk factors.   Short: Continue to stay on top of weight.  Long: Continue to monitor risk factors.   Short: Continue to maintain weight.  Long: Continue to monitor risk factors.   Short: Continue to walk and monitor blood pressure closely.  Long: Continue to monitor risk factors.    [place] 09/24/18 1323             Core Components/Risk Factors/Patient Goals Review   Personal Goals Review  Weight Management/Obesity;Lipids;Hypertension       Review  DaXyonas continued to stay consistent with his weight.  His pressures are still up some with everything going on.  He has been watching it and has been glad to have gotten started on the Benicar.       Expected Outcomes  Short: Continue to  keep close eye on blood pressure.  Long: Continue to monitor risk factors.           Core Components/Risk Factors/Patient Goals at Discharge (Final Review):  Goals and Risk Factor Review - 09/24/18  1323      Core Components/Risk Factors/Patient Goals Review   Personal Goals Review  Weight Management/Obesity;Lipids;Hypertension    Review  Johm has continued to stay consistent with his weight.  His pressures are still up some with everything going on.  He has been watching it and has been glad to have gotten started on the Benicar.    Expected Outcomes  Short: Continue to keep close eye on blood pressure.  Long: Continue to monitor risk factors.        ITP Comments: ITP Comments    Row Name 05/17/18 1354 06/13/18 2633 07/04/18 1033 07/11/18 1214 09/24/18 1307   ITP Comments  Med Review completed. Initial ITP created. Diagnosis can be found in [facility] 04/16/18  30 day review. Continue with ITP unless directed changes by Medical Director chart review. New to program  Our program is currently closed due to COVID-19.  We are communicating with patient via phone calls and emails.    30 day review. Continue with ITP unless directed changes by Medical Director chart review.  Aran returned my call later in the day.    [place] Name 10/22/18 1438           ITP Comments  Called to get Shanon Brow scheduled to return to class, but he has decided to discharge at this time due to his insurance copays          Comments: Discharge ITP

## 2018-10-22 NOTE — Progress Notes (Signed)
Discharge Progress Report  Patient Details  Name: Dominic Osborn MRN: [id number] Date of Birth: 03-17-1946 Referring Provider:     Cardiac Rehab from 05/17/2018 in [facility] Cardiac [facility] Pulmonary [facility]  Referring Provider  [name]       Number of Visits: 19/36  Reason for Discharge:  Patient reached a stable level of exercise. Patient independent in their exercise. Early Exit:  Insurance  Smoking History:  Social History   Tobacco Use  Smoking Status Never Smoker  Smokeless Tobacco Never Used    Diagnosis:  S/P AVR (aortic valve replacement)  ADL UCSD:   Initial Exercise Prescription: Initial Exercise Prescription - 05/17/18 1400      Date of Initial Exercise RX and Referring Provider   Date  05/17/18    Referring Provider  [name]      Treadmill   MPH  3.6    Grade  1.5    Minutes  15    METs  4.5      Recumbant Bike   Level  8    RPM  60    Watts  85    Minutes  15    METs  4.5      Arm Ergometer   Level  3    RPM  40    Minutes  15    METs  4.5      Prescription Details   Frequency (times per week)  3    Duration  Progress to 30 minutes of continuous aerobic without signs/symptoms of physical distress      Intensity   THRR 40-80% of Max Heartrate  102-133    Ratings of Perceived Exertion  11-15    Perceived Dyspnea  0-4      Resistance Training   Training Prescription  Yes    Weight  4    Reps  10-15       Discharge Exercise Prescription (Final Exercise Prescription Changes): Exercise Prescription Changes - 07/04/18 1000      Response to Exercise   Blood Pressure (Admit)  132/64    Blood Pressure (Exercise)  164/72    Blood Pressure (Exit)  124/60    Heart Rate (Admit)  59 bpm    Heart Rate (Exercise)  92 bpm    Heart Rate (Exit)  67 bpm    Rating of Perceived Exertion (Exercise)  14    Symptoms  none    Duration  Continue with 30 min of aerobic exercise without signs/symptoms of physical distress.    Intensity  THRR unchanged       Progression   Progression  Continue to progress workloads to maintain intensity without signs/symptoms of physical distress.    Average METs  3.62      Resistance Training   Training Prescription  Yes    Weight  4 lbs    Reps  10-15      Interval Training   Interval Training  No      Treadmill   MPH  3.5    Grade  1    Minutes  15    METs  4.16      Recumbant Bike   Level  8    Watts  60    Minutes  15    METs  3.89      Arm Ergometer   Level  2    Minutes  15    METs  2.8      Home Exercise Plan  Plans to continue exercise at  [facility] (comment)   Silver Sneakers in [place], push ups, sit ups   Frequency  Add 3 additional days to program exercise sessions.    Initial Home Exercises Provided  06/07/18       Functional Capacity: 6 Minute Walk    Row Name 05/17/18 1445         6 Minute Walk   Phase  Initial     Distance  1900 feet     Walk Time  6 minutes     # of Rest Breaks  0     MPH  3.6     METS  4.72     RPE  13     Perceived Dyspnea   0     VO2 Peak  16.5     Symptoms  No     Resting HR  70 bpm     Resting BP  142/56     Resting Oxygen Saturation   98 %     Max Ex. HR  99 bpm     Max Ex. BP  218/70     2 Minute Post BP  178/68        Psychological, QOL, Others - Outcomes: PHQ 2/9: Depression screen PHQ 2/9 05/17/2018  Decreased Interest 0  Down, Depressed, Hopeless 0  PHQ - 2 Score 0  Altered sleeping 0  Tired, decreased energy 0  Change in appetite 0  Feeling bad or failure about yourself  0  Trouble concentrating 0  Moving slowly or fidgety/restless 0  Suicidal thoughts 0  PHQ-9 Score 0    Quality of Life: Quality of Life - 05/17/18 1415      Quality of Life   Select  Quality of Life      Quality of Life Scores   Health/Function Pre  27.37 %    Socioeconomic Pre  25.71 %    Psych/Spiritual Pre  28.93 %    Family Pre  27.6 %    GLOBAL Pre  27.38 %       Personal Goals: Goals established at orientation with  interventions provided to work toward goal. Personal Goals and Risk Factors at Admission - 05/17/18 1409      Core Components/Risk Factors/Patient Goals on Admission    Weight Management  Yes;Weight Maintenance    Intervention  Weight Management: Develop a combined nutrition and exercise program designed to reach desired caloric intake, while maintaining appropriate intake of nutrient and fiber, sodium and fats, and appropriate energy expenditure required for the weight goal.;Weight Management: Provide education and appropriate resources to help participant work on and attain dietary goals.    Admit Weight  215 lb 8 oz (97.8 kg)    Expected Outcomes  Short Term: Continue to assess and modify interventions until short term weight is achieved;Long Term: Adherence to nutrition and physical activity/exercise program aimed toward attainment of established weight goal;Weight Maintenance: Understanding of the daily nutrition guidelines, which includes 25-35% calories from fat, 7% or less cal from saturated fats, less than 200mg  cholesterol, less than 1.5gm of sodium, & 5 or more servings of fruits and vegetables daily;Understanding recommendations for meals to include 15-35% energy as protein, 25-35% energy from fat, 35-60% energy from carbohydrates, less than 200mg  of dietary cholesterol, 20-35 gm of total fiber daily;Understanding of distribution of calorie intake throughout the day with the consumption of 4-5 meals/snacks    Hypertension  Yes    Intervention  Provide education on  lifestyle modifcations including regular physical activity/exercise, weight management, moderate sodium restriction and increased consumption of fresh fruit, vegetables, and low fat dairy, alcohol moderation, and smoking cessation.;Monitor prescription use compliance.    Expected Outcomes  Short Term: Continued assessment and intervention until BP is < 140/59mm HG in hypertensive participants. < 130/48mm HG in hypertensive  participants with diabetes, heart failure or chronic kidney disease.;Long Term: Maintenance of blood pressure at goal levels.        Personal Goals Discharge: Goals and Risk Factor Review    Row Name 06/07/18 1109 06/26/18 1004 07/19/18 1049 08/09/18 1148 08/30/18 1147     Core Components/Risk Factors/Patient Goals Review   Personal Goals Review  Weight Management/Obesity;Lipids;Hypertension  Weight Management/Obesity;Lipids;Hypertension  Weight Management/Obesity;Lipids;Hypertension  Weight Management/Obesity;Lipids;Hypertension  Weight Management/Obesity;Lipids;Hypertension   Review  Dominic Osborn has been doing well in rehab. His weight has been steady and he weighs daily.  His blood pressures are doing better.  They were high his first day, but have since evened back out. He does check them daily at home along with his weight.  We will continue to keep a close eye on his pressures.    Dominic Osborn continues to lose weight despite regaining his appetite.  He has a follow appointment today for his UTI and surgery.  He has not been checking his pressures much at home, but has not noticed them feeling high.  He has a wrist cuff at home.  We talked about taking it more frequently and keeping a record of it.  He is doing well with his medications and just updated his list last week. He is planning to talk to doctor about adding back in his supplements and dropping his statin.   Dominic Osborn has been doing well at home.  His weight is staying steady and his pressures have been good. Overall, he feels like he is back to his normal!  Dominic Osborn continues to do well at home.  His weight is staying steady around 214 lbs, which he is very happy at.  He has not checked his blood pressure this week, but says he will get back into that routine.  Overall, things are going well for him and he is eager to get back to class.   Dominic Osborn is doing well.  His weight continues to stay steady.  He noted that his blood pressure has been up, partly due to  increased stress at home.  He called Dr. [name] and was started on Benicar.  He will continue to keep a close eye on it.    Expected Outcomes  Short: Continue to weigh daily and watch blood pressures. Long: Continue to monitor risk factors.   Short: Talk to doctor about medications.  Start checking blood pressures again.  Long: Continue to monitor risk factors.   Short: Continue to stay on top of weight.  Long: Continue to monitor risk factors.   Short: Continue to maintain weight.  Long: Continue to monitor risk factors.   Short: Continue to walk and monitor blood pressure closely.  Long: Continue to monitor risk factors.    Dominic Osborn Name 09/24/18 1323             Core Components/Risk Factors/Patient Goals Review   Personal Goals Review  Weight Management/Obesity;Lipids;Hypertension       Review  Dominic Osborn has continued to stay consistent with his weight.  His pressures are still up some with everything going on.  He has been watching it and has been glad to have gotten started on  the Benicar.       Expected Outcomes  Short: Continue to keep close eye on blood pressure.  Long: Continue to monitor risk factors.           Exercise Goals and Review: Exercise Goals    Row Name 05/17/18 1444             Exercise Goals   Increase Physical Activity  Yes       Intervention  Provide advice, education, support and counseling about physical activity/exercise needs.;Develop an individualized exercise prescription for aerobic and resistive training based on initial evaluation findings, risk stratification, comorbidities and participant's personal goals.       Expected Outcomes  Short Term: Attend rehab on a regular basis to increase amount of physical activity.;Long Term: Add in home exercise to make exercise part of routine and to increase amount of physical activity.;Long Term: Exercising regularly at least 3-5 days a week.       Increase Strength and Stamina  Yes       Intervention  Provide advice,  education, support and counseling about physical activity/exercise needs.;Develop an individualized exercise prescription for aerobic and resistive training based on initial evaluation findings, risk stratification, comorbidities and participant's personal goals.       Expected Outcomes  Short Term: Increase workloads from initial exercise prescription for resistance, speed, and METs.;Short Term: Perform resistance training exercises routinely during rehab and add in resistance training at home;Long Term: Improve cardiorespiratory fitness, muscular endurance and strength as measured by increased METs and functional capacity (6MWT)       Able to understand and use rate of perceived exertion (RPE) scale  Yes       Intervention  Provide education and explanation on how to use RPE scale       Expected Outcomes  Short Term: Able to use RPE daily in rehab to express subjective intensity level;Long Term:  Able to use RPE to guide intensity level when exercising independently       Able to understand and use Dyspnea scale  Yes       Intervention  Provide education and explanation on how to use Dyspnea scale       Expected Outcomes  Short Term: Able to use Dyspnea scale daily in rehab to express subjective sense of shortness of breath during exertion;Long Term: Able to use Dyspnea scale to guide intensity level when exercising independently       Knowledge and understanding of Target Heart Rate Range (THRR)  Yes       Intervention  Provide education and explanation of THRR including how the numbers were predicted and where they are located for reference       Expected Outcomes  Short Term: Able to state/look up THRR;Short Term: Able to use daily as guideline for intensity in rehab;Long Term: Able to use THRR to govern intensity when exercising independently       Able to check pulse independently  Yes       Intervention  Review the importance of being able to check your own pulse for safety during independent  exercise;Provide education and demonstration on how to check pulse in carotid and radial arteries.       Expected Outcomes  Short Term: Able to explain why pulse checking is important during independent exercise;Long Term: Able to check pulse independently and accurately       Understanding of Exercise Prescription  Yes       Intervention  Provide education, explanation,  and written materials on patient's individual exercise prescription       Expected Outcomes  Short Term: Able to explain program exercise prescription;Long Term: Able to explain home exercise prescription to exercise independently          Exercise Goals Re-Evaluation: Exercise Goals Re-Evaluation    Row Name 05/29/18 0915 06/06/18 0955 06/06/18 1515 06/07/18 1103 06/20/18 1353     Exercise Goal Re-Evaluation   Exercise Goals Review  Increase Physical Activity;Increase Strength and Stamina;Able to understand and use rate of perceived exertion (RPE) scale;Knowledge and understanding of Target Heart Rate Range (THRR);Understanding of Exercise Prescription  Increase Physical Activity;Increase Strength and Stamina;Understanding of Exercise Prescription  (Pended)   Increase Physical Activity;Increase Strength and Stamina;Understanding of Exercise Prescription  Increase Physical Activity;Increase Strength and Stamina;Understanding of Exercise Prescription;Able to understand and use rate of perceived exertion (RPE) scale;Able to understand and use Dyspnea scale;Knowledge and understanding of Target Heart Rate Range (THRR);Able to check pulse independently  Increase Physical Activity;Increase Strength and Stamina;Understanding of Exercise Prescription   Comments  Reviewed RPE scale, THR and program prescription with pt today.  Pt voiced understanding and was given a copy of goals to take home.   Dominic Osborn is off   (Pended)   Dominic Osborn is off to a good start in rehab.  He has already at 40 watts on the bike.  We will continue to monitor his progress.    Reviewed home exercise with pt today.  Pt plans to walk and continue with Silver Sneakers class at [facility] for exercise.  He is also planning to return to his sit ups and push ups.  Reviewed THR, pulse, RPE, sign and symptoms, NTG use, and when to call 911 or MD.  Also discussed weather considerations and indoor options.  Pt voiced understanding.  Vontrell has been doing well in rehab.  He missed one day last week due to  UTI and was surprised that he felt pretty good coming back to class yesterday.  He is up to 47 watts on the recumbent bike.  We will continue to monitor his progress.    Expected Outcomes  Short: Use RPE daily to regulate intensity. Long: Follow program prescription in THR.  -  Short: Increase workloads and review home exercise guidelines.  Long: Continue to increase strength and stamina.   Short: Continue with exercise in [employer]neakers and add back in push ups and sit ups.  Long: Continue to increase strength and stamina.   Short: Continue to increase workloads.  Long: Continue to improve strength and stamina.    [place] Name 06/26/18 8341 07/04/18 1034 07/19/18 1035 08/09/18 1141 08/30/18 1143     Exercise Goal Re-Evaluation   Exercise Goals Review  Increase Physical Activity;Increase Strength and Stamina;Understanding of Exercise Prescription  Increase Physical Activity;Increase Strength and Stamina;Understanding of Exercise Prescription  Increase Physical Activity;Increase Strength and Stamina;Understanding of Exercise Prescription  Increase Physical Activity;Increase Strength and Stamina;Understanding of Exercise Prescription  Increase Physical Activity;Increase Strength and Stamina;Understanding of Exercise Prescription   Comments  Rockey is doing well in rehab.  He is doing his home exercise some and rebuilding his push ups back to presurgery levels.  He is losing weight some.  He has been able to mow his lawn and his regained his strength and stamina.   Dominic Osborn continues to do well in rehab.   He is now up to 60 watts on the bike.  He is moving at home and doing yard work.  He is going to  walk and do some of the videos that we will be sending out. We will continue to monitor his progress at home.   Dominic Osborn is doing well. He is exercising at home each day!  He is aiming for 30 min each day.  He has been watching our videos and enjoys seeing familiar faces.  He mentioned that his wife has even begun to notice differences in his stamina.  He has been able to get outside and work in the yard again.  He feels better now than he did before his surgery!  Dominic Osborn has been doing well with his exericse at home.  He continues to walk at least 30 min each time.  He has also been able to get out more to work in the yard!  He enjoys being outside again. He feels like his strength and stamina are better now than they were before his surgery!!  Dominic Osborn continues to get in his walks daily at home.  He finds that it helps him with his stress levels too.  He is looking forward to getting back to class.  He has enjoyed our videos as well.    Expected Outcomes  Short: Continue to recover from UTI  Long: Continue to rebuild push ups.   Short: Continue to walk at home.  Long; Continue to build strength.   Short: Contiue to walk daily.  Long: Continue to rebuild his strength back up.   Short: Continue to walk and exercise daily!  Long: Continue to build strength and stamina.   Short: Continue to walk.  Long: Continue to rebuild his stamina.    Dominic Osborn Name 09/24/18 1307             Exercise Goal Re-Evaluation   Exercise Goals Review  Increase Physical Activity;Increase Strength and Stamina;Understanding of Exercise Prescription       Comments  Dominic Osborn continues to walk daily. He also continues to use the videos on occasion. He is feeling pretty good overall, but still eager to get back to class.        Expected Outcomes  Short: Continue to walk.  Long: Continue to rebuild his stamina.           Nutrition & Weight -  Outcomes: Pre Biometrics - 05/17/18 1443      Pre Biometrics   Height  6' 3.75" (1.924 m)    Weight  215 lb 8 oz (97.8 kg)    Waist Circumference  39.5 inches    Hip Circumference  44 inches    Waist to Hip Ratio  0.9 %    BMI (Calculated)  26.41    Single Leg Stand  30 seconds        Nutrition: Nutrition Therapy & Goals - 07/12/18 1349      Nutrition Therapy   Diet  Heart Healthy Low Sodium Diet 2000-2200kcal (BMI: 26.4)(wst: 39.5 inches)    Protein (specify units)  80-90g    Fiber  30 grams    Whole Grain Foods  3 servings    Saturated Fats  12 max. grams    Fruits and Vegetables  5 servings/day    Sodium  1.5 grams      Personal Nutrition Goals   Nutrition Goal  ST: monitor sodium intake LT: maintain weight (212-215lbs)    Comments  Pt reports wanting to keep his weight where it is, doesn't track Na much, limited whole grain intake (will eat sometimes). Ptreports eating cereal with 2% milk, bagel/english muffin, coffee(decaf with  splenda), L: subway (black forest ham with veggies, light o/v and s/p, [place] herb cheese 6 inch) or takeout salad. Dinner: "proper vegetables and small meat". Pt just went for brisk walk reports eating a banana and has incorporated f/v into his diet well. Discussed the importanc eof whole grains and salt, no changes ready to be made, discussed giving him more info on how much Na is in the subway sandwich.       Intervention Plan   Intervention  Prescribe, educate and counsel regarding individualized specific dietary modifications aiming towards targeted core components such as weight, hypertension, lipid management, diabetes, heart failure and other comorbidities.    Expected Outcomes  Short Term Goal: Understand basic principles of dietary content, such as calories, fat, sodium, cholesterol and nutrients.;Short Term Goal: A plan has been developed with personal nutrition goals set during dietitian appointment.;Long Term Goal: Adherence to prescribed  nutrition plan.       Nutrition Discharge: Nutrition Assessments - 05/17/18 1419      MEDFICTS Scores   Pre Score  74       Education Questionnaire Score: Knowledge Questionnaire Score - 05/17/18 1411      Knowledge Questionnaire Score   Pre Score  24/26   correct answers reviewed with Dominic Osborn. Focus on exercise      Goals reviewed with patient; copy given to patient.

## 2019-10-25 ENCOUNTER — Other Ambulatory Visit: Payer: Self-pay | Admitting: Internal Medicine

## 2019-10-25 DIAGNOSIS — K7689 Other specified diseases of liver: Secondary | ICD-10-CM

## 2019-10-31 ENCOUNTER — Other Ambulatory Visit: Payer: Self-pay

## 2019-10-31 ENCOUNTER — Ambulatory Visit
Admission: RE | Admit: 2019-10-31 | Discharge: 2019-10-31 | Disposition: A | Discharge status: Home / Self Care | Payer: Medicare HMO | Source: Ambulatory Visit | Attending: Internal Medicine | Admitting: Internal Medicine

## 2019-10-31 DIAGNOSIS — K7689 Other specified diseases of liver: Secondary | ICD-10-CM | POA: Insufficient documentation

## 2020-03-31 ENCOUNTER — Ambulatory Visit: Payer: Commercial Managed Care - HMO | Admitting: Urology

## 2020-04-08 ENCOUNTER — Ambulatory Visit: Payer: Medicare HMO | Admitting: Urology

## 2020-04-08 ENCOUNTER — Other Ambulatory Visit: Payer: Self-pay

## 2020-04-08 ENCOUNTER — Encounter: Payer: Self-pay | Admitting: Urology

## 2020-04-08 VITALS — BP 109/67 | HR 69 | Ht 76.0 in | Wt 214.0 lb

## 2020-04-08 DIAGNOSIS — N368 Other specified disorders of urethra: Secondary | ICD-10-CM

## 2020-04-08 LAB — URINALYSIS, COMPLETE
Bilirubin, UA: NEGATIVE
Glucose, UA: NEGATIVE
Ketones, UA: NEGATIVE
Leukocytes,UA: NEGATIVE
Nitrite, UA: NEGATIVE
Protein,UA: NEGATIVE
RBC, UA: NEGATIVE
Specific Gravity, UA: 1.025 (ref 1.005–1.030)
Urobilinogen, Ur: 0.2 mg/dL (ref 0.2–1.0)
pH, UA: 5.5 (ref 5.0–7.5)

## 2020-04-08 LAB — MICROSCOPIC EXAMINATION: Epithelial Cells (non renal): NONE SEEN /hpf (ref 0–10)

## 2020-04-08 NOTE — Progress Notes (Signed)
04/08/2020 11:03 AM   Dominic Osborn Aug 19, 1945 [id number]  Referring provider: Rusty Aus, MD [name] [place],  [place] [address]  Chief Complaint  Patient presents with   Other    HPI: Dominic Osborn is a 74 y.o. male seen at request of Dr. [name] for evaluation of urethral bleeding.   Intermittent blood per urethra and spotting on underwear for 2 weeks and late November 2021  Denied gross hematuria  No bothersome LUTS  No flank, abdominal or pelvic pain  PSA April 2021 was 2.75   PMH: Past Medical History:  Diagnosis Date   Anxiety    Bleeding disorder ([place])    Hypertension     Surgical History: Past Surgical History:  Procedure Laterality Date   CARDIAC VALVE REPLACEMENT     COLONOSCOPY WITH PROPOFOL N/A 10/01/2018   Procedure: COLONOSCOPY WITH PROPOFOL;  Surgeon: Manya Silvas, MD;  Location: [facility] ENDOSCOPY;  Service: Endoscopy;  Laterality: N/A;   TONSILLECTOMY      Home Medications:  Allergies as of 04/08/2020   No Known Allergies     Medication List       Accurate as of April 08, 2020 11:59 PM. If you have any questions, ask your nurse or doctor.        STOP taking these medications   amiodarone 200 MG tablet Commonly known as: PACERONE Stopped by: Abbie Sons, MD   amoxicillin 500 MG tablet Commonly known as: AMOXIL Stopped by: Abbie Sons, MD   atenolol 25 MG tablet Commonly known as: TENORMIN Stopped by: Abbie Sons, MD   Calcium Carbonate-Vitamin D 600-200 MG-UNIT Tabs Stopped by: Abbie Sons, MD   cetirizine 10 MG tablet Commonly known as: ZYRTEC Stopped by: Abbie Sons, MD   Cholecalciferol 50 MCG (2000 UT) Tabs Stopped by: Abbie Sons, MD   doxylamine (Sleep) 25 MG tablet Commonly known as: UNISOM Stopped by: Abbie Sons, MD   fluticasone 50 MCG/ACT nasal spray Commonly known as: FLONASE Stopped by: Abbie Sons, MD    furosemide 40 MG tablet Commonly known as: LASIX Stopped by: Abbie Sons, MD   Magnesium 300 MG Caps Stopped by: Abbie Sons, MD   magnesium oxide 400 MG tablet Commonly known as: MAG-OX Stopped by: Abbie Sons, MD   metoprolol succinate 50 MG 24 hr tablet Commonly known as: TOPROL-XL Stopped by: Abbie Sons, MD   naproxen sodium 220 MG tablet Commonly known as: ALEVE Stopped by: Abbie Sons, MD   olmesartan-hydrochlorothiazide 40-12.5 MG tablet Commonly known as: BENICAR HCT Stopped by: Abbie Sons, MD   potassium chloride SA 20 MEQ tablet Commonly known as: KLOR-CON Stopped by: Abbie Sons, MD   simvastatin 10 MG tablet Commonly known as: ZOCOR Stopped by: Abbie Sons, MD   traZODone 50 MG tablet Commonly known as: DESYREL Stopped by: Abbie Sons, MD     TAKE these medications   amLODipine 5 MG tablet Commonly known as: NORVASC TAKE 1 TABLET ONE TIME DAILY   aspirin 81 MG chewable tablet Chew 81 mg by mouth daily.   Multi-Vitamins Tabs Take by mouth.       Allergies: No Known Allergies  Family History: Family History  Problem Relation Age of Onset   Bladder Cancer Neg Hx    Prostate cancer Neg Hx    Kidney cancer Neg Hx     Social History:  reports that  he has never smoked. He has never used smokeless tobacco. He reports current alcohol use. He reports that he does not use drugs.   Physical Exam: BP 109/67    Pulse 69    Ht 6\' 4"  (1.93 m)    Wt 214 lb (97.1 kg)    BMI 26.05 kg/m   Constitutional:  Alert and oriented, No acute distress. HEENT: [place] AT, moist mucus membranes.  Trachea midline, no masses. Cardiovascular: No clubbing, cyanosis, or edema. Respiratory: Normal respiratory effort, no increased work of breathing. GI: Abdomen is soft, nontender, nondistended, no abdominal masses GU: Phallus circumcised without lesions, meatus normal without lesions; testes descended bilateral.masses or tenderness.   Prostate 40 g, smooth without nodules Skin: No rashes, bruises or suspicious lesions. Neurologic: Grossly intact, no focal deficits, moving all 4 extremities. Psychiatric: Normal mood and affect.   Assessment & Plan:    1.  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

## 2020-04-12 ENCOUNTER — Encounter: Payer: Self-pay | Admitting: Urology

## 2020-04-24 ENCOUNTER — Ambulatory Visit: Payer: Medicare HMO | Admitting: Urology

## 2020-04-24 ENCOUNTER — Encounter: Payer: Self-pay | Admitting: Urology

## 2020-04-24 ENCOUNTER — Other Ambulatory Visit: Payer: Self-pay

## 2020-04-24 VITALS — BP 155/88 | HR 61 | Ht 75.0 in | Wt 216.0 lb

## 2020-04-24 DIAGNOSIS — N368 Other specified disorders of urethra: Secondary | ICD-10-CM

## 2020-04-24 LAB — URINALYSIS, COMPLETE
Bilirubin, UA: NEGATIVE
Glucose, UA: NEGATIVE
Ketones, UA: NEGATIVE
Leukocytes,UA: NEGATIVE
Nitrite, UA: NEGATIVE
Protein,UA: NEGATIVE
Specific Gravity, UA: 1.02 (ref 1.005–1.030)
Urobilinogen, Ur: 0.2 mg/dL (ref 0.2–1.0)
pH, UA: 6.5 (ref 5.0–7.5)

## 2020-04-24 LAB — MICROSCOPIC EXAMINATION
Bacteria, UA: NONE SEEN
Epithelial Cells (non renal): NONE SEEN /hpf (ref 0–10)

## 2020-04-24 NOTE — Progress Notes (Signed)
° °  04/24/20  CC:  Chief Complaint  Patient presents with   Cysto    HPI: Refer to my prior note 04/08/2020.  Blood pressure (!) 155/88, pulse 61, height 6\' 3"  (1.905 m), weight 216 lb (98 kg). NED. A&Ox3.   No respiratory distress   Abd soft, NT, ND Normal phallus with bilateral descended testicles  Cystoscopy Procedure Note  Patient identification was confirmed, informed consent was obtained, and patient was prepped using Betadine solution.  Lidocaine jelly was administered per urethral meatus.     Pre-Procedure: - Inspection reveals a normal caliber urethral meatus without lesion.  Procedure: The flexible cystoscope was introduced without difficulty - No urethral strictures/lesions are present. 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

## 2020-04-25 ENCOUNTER — Encounter: Payer: Self-pay | Admitting: Urology

## 2021-03-15 ENCOUNTER — Ambulatory Visit: Payer: Medicare HMO | Admitting: Urology

## 2021-03-15 ENCOUNTER — Other Ambulatory Visit: Payer: Self-pay

## 2021-03-15 ENCOUNTER — Encounter: Payer: Self-pay | Admitting: Urology

## 2021-03-15 VITALS — BP 135/78 | HR 58 | Ht 76.0 in | Wt 212.0 lb

## 2021-03-15 DIAGNOSIS — R972 Elevated prostate specific antigen [PSA]: Secondary | ICD-10-CM

## 2021-03-15 NOTE — Progress Notes (Signed)
03/15/2021 10:15 AM   Dominic Osborn 06/20/1945 [id number]  Referring provider: Rusty Aus, MD [facility] [place],  [place] [address]  Chief Complaint  Patient presents with   Elevated PSA    HPI: 75 y.o. male presents for annual follow-up.  Seen 04/08/2020 for urethral bleeding Cystoscopy with BPH/hypervascularity Referred for PSA 4.41 02/18/2021 Baseline PSA upper 2 range States this also occurred in 2008 and he was treated with an antibiotic course and repeat PSA returned to baseline No bothersome LUTS.  Denies recurrent urethral bleeding He was prescribed a course of Cipro which he has completed   PMH: Past Medical History:  Diagnosis Date   Anxiety    Bleeding disorder ([place])    Hypertension     Surgical History: Past Surgical History:  Procedure Laterality Date   CARDIAC VALVE REPLACEMENT     COLONOSCOPY WITH PROPOFOL N/A 10/01/2018   Procedure: COLONOSCOPY WITH PROPOFOL;  Surgeon: Manya Silvas, MD;  Location: [facility] ENDOSCOPY;  Service: Endoscopy;  Laterality: N/A;   TONSILLECTOMY      Home Medications:  Allergies as of 03/15/2021   No Known Allergies      Medication List        Accurate as of March 15, 2021 10:15 AM. If you have any questions, ask your nurse or doctor.          acetaminophen 650 MG CR tablet Commonly known as: TYLENOL Take 650 mg by mouth every 8 (eight) hours as needed for pain.   amLODipine 5 MG tablet Commonly known as: NORVASC TAKE 1 TABLET ONE TIME DAILY   amoxicillin 500 MG capsule Commonly known as: AMOXIL Take 500 mg by mouth 3 (three) times daily.   aspirin 81 MG chewable tablet Chew 81 mg by mouth daily.   calcium-vitamin D 500-200 MG-UNIT tablet Commonly known as: OSCAL WITH D Take 1 tablet by mouth.   diphenhydrAMINE 50 MG capsule Commonly known as: BENADRYL Take 50 mg by mouth every 6 (six) hours as needed.   hydrochlorothiazide 25 MG  tablet Commonly known as: HYDRODIURIL Take 25 mg by mouth daily.   Magnesium Oxide 500 MG Caps Take by mouth.   metoprolol succinate 25 MG 24 hr tablet Commonly known as: TOPROL-XL Take by mouth.   Multi-Vitamins Tabs Take by mouth.   telmisartan 80 MG tablet Commonly known as: MICARDIS Take 80 mg by mouth daily.        Allergies: No Known Allergies  Family History: Family History  Problem Relation Age of Onset   Bladder Cancer Neg Hx    Prostate cancer Neg Hx    Kidney cancer Neg Hx     Social History:  reports that he has never smoked. He has never used smokeless tobacco. He reports current alcohol use. He reports that he does not use drugs.   Physical Exam: BP 135/78   Pulse (!) 58   Ht 5\' 10"  (1.778 m)   Wt 212 lb (96.2 kg)   BMI 30.42 kg/m   Constitutional:  Alert and oriented, No acute distress. HEENT: [place] AT, moist mucus membranes.  Trachea midline, no masses. Cardiovascular: No clubbing, cyanosis, or edema. Respiratory: Normal respiratory effort, no increased work of breathing. GU: Prostate 40 g, smooth without nodules Skin: No rashes, bruises or suspicious lesions. Neurologic: Grossly intact, no focal deficits, moving all 4 extremities. Psychiatric: Normal mood and affect.   Assessment & Plan:    1.  Elevated PSA New problem  We discussed empiric antibiotic therapy is no longer recommended for PSA elevations with a negative UA and that any noticeable decrease in PSA is more reflection of time No intercourse/ejaculation within the last 24 hours and PSA was repeated today If PSA remains elevated we discussed options of prostate biopsy, prostate MRI and surveillance He would like to proceed with MRI if PSA remains elevated   Abbie Sons, MD  [place] [address], [place], [place] [address] [phone]

## 2021-03-16 ENCOUNTER — Other Ambulatory Visit: Payer: Self-pay | Admitting: Urology

## 2021-03-16 ENCOUNTER — Encounter: Payer: Self-pay | Admitting: Urology

## 2021-03-16 DIAGNOSIS — R972 Elevated prostate specific antigen [PSA]: Secondary | ICD-10-CM

## 2021-03-16 LAB — PSA: Prostate Specific Ag, Serum: 5.7 ng/mL — ABNORMAL HIGH (ref 0.0–4.0)

## 2021-04-06 ENCOUNTER — Ambulatory Visit: Payer: Medicare HMO

## 2021-04-14 ENCOUNTER — Ambulatory Visit
Admission: RE | Admit: 2021-04-14 | Discharge: 2021-04-14 | Disposition: A | Discharge status: Home / Self Care | Payer: Medicare HMO | Source: Ambulatory Visit | Attending: Urology | Admitting: Urology

## 2021-04-14 ENCOUNTER — Other Ambulatory Visit: Payer: Self-pay

## 2021-04-14 DIAGNOSIS — R972 Elevated prostate specific antigen [PSA]: Secondary | ICD-10-CM | POA: Diagnosis present

## 2021-04-14 MED ORDER — GADOBUTROL 1 MMOL/ML IV SOLN
9.0000 mL | Freq: Once | INTRAVENOUS | Status: AC | PRN
  Administered 2021-04-14: 09:00:00 9 mL via INTRAVENOUS

## 2021-04-15 ENCOUNTER — Encounter: Payer: Self-pay | Admitting: Urology

## 2021-04-22 ENCOUNTER — Other Ambulatory Visit: Payer: Self-pay | Admitting: Urology

## 2021-04-22 ENCOUNTER — Telehealth: Payer: Self-pay | Admitting: Family Medicine

## 2021-04-22 DIAGNOSIS — R972 Elevated prostate specific antigen [PSA]: Secondary | ICD-10-CM

## 2021-04-22 NOTE — Telephone Encounter (Signed)
REQUEST FOR CARDIAC CLEARANCE       Date: 04/22/2021  Request Clearance from Dr. Jonna Clark to: [phone]  Procedure: Fusion Biosy  Date of Procedure: TBD  Provider: Dr. Bernardo Heater    Cardiac  Clearance : Yes  Reason: Stop Aspirin 5 days prior to procedure     Risk Assessment:    Low   []       Moderate   []     High   []           This patient is optimized for surgery  YES []       NO   []    I recommend further assessment/workup prior to procedure:    YES []      NO  []   Appointment scheduled for: _______________________   Further recommendations: ______________________________    Physician Signature:__________________________________   Printed Name: ________________________________________   Date: _________________

## 2021-04-27 NOTE — Telephone Encounter (Signed)
Cardiac clearance has been faxed back. He is approved to stop Aspirin 5 days prior. Clearance was scanned in chart.

## 2021-05-12 ENCOUNTER — Other Ambulatory Visit: Payer: Self-pay | Admitting: Urology

## 2021-05-20 ENCOUNTER — Encounter: Payer: Self-pay | Admitting: Urology

## 2021-11-11 ENCOUNTER — Other Ambulatory Visit: Payer: Self-pay | Admitting: Family Medicine

## 2021-11-11 DIAGNOSIS — R972 Elevated prostate specific antigen [PSA]: Secondary | ICD-10-CM

## 2021-11-18 ENCOUNTER — Other Ambulatory Visit: Payer: Medicare HMO

## 2021-11-18 DIAGNOSIS — R972 Elevated prostate specific antigen [PSA]: Secondary | ICD-10-CM

## 2021-11-19 LAB — PSA: Prostate Specific Ag, Serum: 4.6 ng/mL — ABNORMAL HIGH (ref 0.0–4.0)

## 2021-11-22 ENCOUNTER — Ambulatory Visit: Payer: Medicare HMO | Admitting: Urology

## 2021-11-22 ENCOUNTER — Encounter: Payer: Self-pay | Admitting: Urology

## 2021-11-22 VITALS — BP 120/73 | HR 59 | Ht 75.0 in | Wt 214.0 lb

## 2021-11-22 DIAGNOSIS — R972 Elevated prostate specific antigen [PSA]: Secondary | ICD-10-CM

## 2021-11-22 NOTE — Progress Notes (Signed)
11/22/2021 9:53 AM   Dominic Osborn 10/03/1945 [id number]  Referring provider: Rusty Aus, MD [place] [facility] [place],  [place] [address]  Chief Complaint  Patient presents with   Elevated PSA    Urologic history: 1.  History urethral bleeding  02/2020 Cystoscopy BPH with hypervascularity  2.  Elevated PSA 4.41 on 02/18/2021 (baseline 2.5) Repeat PSA 5.7 Prostate MRI 03/2021 88 g gland; PI-RADS 3 lesion right posterolateral and posteromedial PZ Fusion biopsy 04/2021 with negative 12 core template and 4 ROI biopsies   HPI: 76 y.o. male presents for annual follow-up.  Doing well since last visit No bothersome LUTS Denies dysuria, gross hematuria Denies flank, abdominal or pelvic pain PSA was checked at his annual physical 08/2021 and was 4.41 PSA ordered prior to this visit stable at 4.6   PMH: Past Medical History:  Diagnosis Date   Anxiety    Bleeding disorder ([place])    Hypertension     Surgical History: Past Surgical History:  Procedure Laterality Date   CARDIAC VALVE REPLACEMENT     COLONOSCOPY WITH PROPOFOL N/A 10/01/2018   Procedure: COLONOSCOPY WITH PROPOFOL;  Surgeon: Manya Silvas, MD;  Location[facility] ENDOSCOPY;  Service: Endoscopy;  Laterality: N/A;   TONSILLECTOMY      Home Medications:  Allergies as of 11/22/2021       Reactions   Quinolones    Other reaction(s): Other (See Comments) Fluroquinolone antibiotics should be avoided in patients with history of aortic aneurysm/dissection         Medication List        Accurate as of November 22, 2021  9:53 AM. If you have any questions, ask your nurse or doctor.          acetaminophen 650 MG CR tablet Commonly known as: TYLENOL Take 650 mg by mouth every 8 (eight) hours as needed for pain.   amLODipine 5 MG tablet Commonly known as: NORVASC TAKE 1 TABLET ONE TIME DAILY   amoxicillin 500 MG capsule Commonly known as: AMOXIL Take 500 mg by mouth  3 (three) times daily.   aspirin 81 MG chewable tablet Chew 81 mg by mouth daily.   calcium-vitamin D 500-200 MG-UNIT tablet Commonly known as: OSCAL WITH D Take 1 tablet by mouth.   diphenhydrAMINE 50 MG capsule Commonly known as: BENADRYL Take 50 mg by mouth every 6 (six) hours as needed.   hydrochlorothiazide 25 MG tablet Commonly known as: HYDRODIURIL Take 25 mg by mouth daily.   Magnesium Oxide -Mg Supplement 500 MG Caps Take by mouth.   metoprolol succinate 25 MG 24 hr tablet Commonly known as: TOPROL-XL Take by mouth.   Multi-Vitamins Tabs Take by mouth.   telmisartan 80 MG tablet Commonly known as: MICARDIS Take 80 mg by mouth daily.        Allergies:  Allergies  Allergen Reactions   Quinolones     Other reaction(s): Other (See Comments) Fluroquinolone antibiotics should be avoided in patients with history of aortic aneurysm/dissection     Family History: Family History  Problem Relation Age of Onset   Bladder Cancer Neg Hx    Prostate cancer Neg Hx    Kidney cancer Neg Hx     Social History:  reports that he has never smoked. He has never used smokeless tobacco. He reports current alcohol use. He reports that he does not use drugs.   Physical Exam: BP 120/73   Pulse (!) 59   Ht  $'6\' 3"'t$  (8.006 m)   Wt 214 lb (97.1 kg)   BMI 26.75 kg/m   Constitutional:  Alert and oriented, No acute distress. HEENT: [place] AT, moist mucus membranes.  Trachea midline, no masses. Cardiovascular: No clubbing, cyanosis, or edema. Respiratory: Normal respiratory effort, no increased work of breathing. GU: Prostate 40 g, smooth without nodules Skin: No rashes, bruises or suspicious lesions. Neurologic: Grossly intact, no focal deficits, moving all 4 extremities. Psychiatric: Normal mood and affect.   Assessment & Plan:    1.  Elevated PSA Stable PSA Benign fusion biopsy January 2023 Lab visit PSA 6 months Continue annual visit with Dr. [name] Office visit/DRE  with PSA November 2024   Abbie Sons, MD  [facility] [address] [place], [place] [address] [phone]

## 2022-05-20 ENCOUNTER — Other Ambulatory Visit: Payer: Self-pay

## 2022-05-20 DIAGNOSIS — R972 Elevated prostate specific antigen [PSA]: Secondary | ICD-10-CM

## 2022-05-23 ENCOUNTER — Other Ambulatory Visit: Payer: Medicare HMO

## 2022-05-23 DIAGNOSIS — R972 Elevated prostate specific antigen [PSA]: Secondary | ICD-10-CM

## 2022-05-24 LAB — PSA: Prostate Specific Ag, Serum: 4.9 ng/mL — ABNORMAL HIGH (ref 0.0–4.0)

## 2022-05-26 ENCOUNTER — Other Ambulatory Visit: Payer: Medicare HMO

## 2022-05-27 ENCOUNTER — Encounter: Payer: Self-pay | Admitting: *Deleted

## 2022-08-19 ENCOUNTER — Other Ambulatory Visit: Payer: Self-pay | Admitting: Nurse Practitioner

## 2022-08-19 DIAGNOSIS — R1310 Dysphagia, unspecified: Secondary | ICD-10-CM

## 2022-09-05 ENCOUNTER — Ambulatory Visit
Admission: RE | Admit: 2022-09-05 | Discharge: 2022-09-05 | Disposition: A | Discharge status: Home / Self Care | Payer: Medicare HMO | Source: Ambulatory Visit | Attending: Nurse Practitioner | Admitting: Nurse Practitioner

## 2022-09-05 DIAGNOSIS — R1310 Dysphagia, unspecified: Secondary | ICD-10-CM

## 2022-09-13 ENCOUNTER — Encounter: Payer: Self-pay | Admitting: Urology

## 2022-10-06 ENCOUNTER — Other Ambulatory Visit: Payer: Self-pay | Admitting: Otolaryngology

## 2022-10-06 DIAGNOSIS — R221 Localized swelling, mass and lump, neck: Secondary | ICD-10-CM

## 2022-10-11 ENCOUNTER — Ambulatory Visit
Admission: RE | Admit: 2022-10-11 | Discharge: 2022-10-11 | Disposition: A | Discharge status: Home / Self Care | Payer: Medicare HMO | Source: Ambulatory Visit | Attending: Otolaryngology | Admitting: Otolaryngology

## 2022-10-11 DIAGNOSIS — R221 Localized swelling, mass and lump, neck: Secondary | ICD-10-CM

## 2022-11-16 ENCOUNTER — Encounter: Payer: Self-pay | Admitting: Internal Medicine

## 2022-11-22 ENCOUNTER — Encounter: Payer: Self-pay | Admitting: Internal Medicine

## 2022-11-23 ENCOUNTER — Ambulatory Visit: Payer: Medicare HMO | Admitting: Anesthesiology

## 2022-11-23 ENCOUNTER — Encounter: Payer: Self-pay | Admitting: Internal Medicine

## 2022-11-23 ENCOUNTER — Encounter
Admission: RE | Disposition: A | Discharge status: Home / Self Care | Payer: Self-pay | Source: Home / Self Care | Attending: Internal Medicine

## 2022-11-23 ENCOUNTER — Ambulatory Visit
Admission: RE | Admit: 2022-11-23 | Discharge: 2022-11-23 | Disposition: A | Discharge status: Home / Self Care | Payer: Medicare HMO | Attending: Internal Medicine | Admitting: Internal Medicine

## 2022-11-23 DIAGNOSIS — K222 Esophageal obstruction: Secondary | ICD-10-CM | POA: Insufficient documentation

## 2022-11-23 DIAGNOSIS — K449 Diaphragmatic hernia without obstruction or gangrene: Secondary | ICD-10-CM | POA: Insufficient documentation

## 2022-11-23 DIAGNOSIS — K219 Gastro-esophageal reflux disease without esophagitis: Secondary | ICD-10-CM | POA: Insufficient documentation

## 2022-11-23 DIAGNOSIS — I10 Essential (primary) hypertension: Secondary | ICD-10-CM | POA: Insufficient documentation

## 2022-11-23 DIAGNOSIS — R1310 Dysphagia, unspecified: Secondary | ICD-10-CM | POA: Diagnosis not present

## 2022-11-23 HISTORY — DX: Thrombocytopenia, unspecified: D69.6

## 2022-11-23 HISTORY — DX: Benign neoplasm, unspecified site: D36.9

## 2022-11-23 HISTORY — DX: Ventricular premature depolarization: I49.3

## 2022-11-23 HISTORY — DX: Anemia, unspecified: D64.9

## 2022-11-23 HISTORY — DX: Cardiac arrhythmia, unspecified: I49.9

## 2022-11-23 HISTORY — DX: Elevated prostate specific antigen (PSA): R97.20

## 2022-11-23 HISTORY — PX: ESOPHAGEAL DILATION: SHX303

## 2022-11-23 HISTORY — PX: ESOPHAGOGASTRODUODENOSCOPY (EGD) WITH PROPOFOL: SHX5813

## 2022-11-23 SURGERY — ESOPHAGOGASTRODUODENOSCOPY (EGD) WITH PROPOFOL
Anesthesia: General

## 2022-11-23 MED ORDER — PROPOFOL 10 MG/ML IV BOLUS
INTRAVENOUS | Status: DC | PRN
Start: 2022-11-23 — End: 2022-11-23
  Administered 2022-11-23: 80 mg via INTRAVENOUS

## 2022-11-23 MED ORDER — SODIUM CHLORIDE 0.9 % IV SOLN
INTRAVENOUS | Status: DC
  Administered 2022-11-23: 1000 mL via INTRAVENOUS

## 2022-11-23 MED ORDER — GLYCOPYRROLATE 0.2 MG/ML IJ SOLN
INTRAMUSCULAR | Status: DC | PRN
  Administered 2022-11-23: .1 mg via INTRAVENOUS

## 2022-11-23 MED ORDER — LIDOCAINE HCL (CARDIAC) PF 100 MG/5ML IV SOSY
PREFILLED_SYRINGE | INTRAVENOUS | Status: DC | PRN
  Administered 2022-11-23: 50 mg via INTRAVENOUS

## 2022-11-23 MED ORDER — PROPOFOL 500 MG/50ML IV EMUL
INTRAVENOUS | Status: DC | PRN
  Administered 2022-11-23: 125 ug/kg/min via INTRAVENOUS

## 2022-11-23 NOTE — Op Note (Signed)
[facility] Patient Name: Dominic Osborn Procedure Date: 11/23/2022 12:25 PM MRN: [id number] Account #: [id number] Date of Birth: 1945/08/13 Admit Type: Outpatient Age: 77 Room: [facility] ENDO ROOM 2 Gender: Male Note Status: Finalized Instrument Name: Upper Endoscope [id number] Procedure:             Upper GI endoscopy Indications:           Dysphagia, Gastro-esophageal reflux disease Providers:             Boykin Nearing. Norma Fredrickson MD, MD Referring MD:          Danella Penton, MD (Referring MD) Medicines:             Propofol per Anesthesia Complications:         No immediate complications. Procedure:             Pre-Anesthesia Assessment:                        - The risks and benefits of the procedure and the                         sedation options and risks were discussed with the                         patient. All questions were answered and informed                         consent was obtained.                        - Patient identification and proposed procedure were                         verified prior to the procedure by the nurse. The                         procedure was verified in the procedure room.                        - ASA Grade Assessment: III - A patient with severe                         systemic disease.                        - After reviewing the risks and benefits, the patient                         was deemed in satisfactory condition to undergo the                         procedure.                        After obtaining informed consent, the endoscope was                         passed under direct vision. Throughout the procedure,  the patient's blood pressure, pulse, and oxygen                         saturations were monitored continuously. The Endoscope                         was introduced through the mouth, and advanced to the                         third part of duodenum. The upper GI endoscopy was                          accomplished without difficulty. The patient tolerated                         the procedure well. Findings:      A mild Schatzki ring was found in the lower third of the esophagus. The       scope was withdrawn. Dilation was performed with a Maloney dilator with       no resistance at 54 Fr.      A 2 cm hiatal hernia was present.      The exam of the stomach was otherwise normal.      The examined duodenum was normal. Impression:            - Mild Schatzki ring. Dilated.                        - 2 cm hiatal hernia.                        - Normal examined duodenum.                        - No specimens collected. Recommendation:        - Patient has a contact number available for                         emergencies. The signs and symptoms of potential                         delayed complications were discussed with the patient.                         Return to normal activities tomorrow. Written                         discharge instructions were provided to the patient.                        - Resume previous diet.                        - Continue present medications.                        - Return to GI office as previously scheduled.                        - The findings and recommendations were discussed with  the patient. Procedure Code(s):     --- Professional ---                        [phone], Esophagogastroduodenoscopy, flexible,                         transoral; diagnostic, including collection of                         specimen(s) by brushing or washing, when performed                         (separate procedure)                        43450, Dilation of esophagus, by unguided sound or                         bougie, single or multiple passes Diagnosis Code(s):     --- Professional ---                        K21.9, Gastro-esophageal reflux disease without                         esophagitis                         R13.10, Dysphagia, unspecified                        K44.9, Diaphragmatic hernia without obstruction or                         gangrene                        K22.2, Esophageal obstruction CPT copyright 2022 American Medical Association. All rights reserved. The codes documented in this report are preliminary and upon coder review may  be revised to meet current compliance requirements. Stanton Kidney MD, MD 11/23/2022 12:51:47 PM This report has been signed electronically. Number of Addenda: 0 Note Initiated On: 11/23/2022 12:25 PM Estimated Blood Loss:  Estimated blood loss: none.      [facility]

## 2022-11-23 NOTE — Anesthesia Postprocedure Evaluation (Signed)
Anesthesia Post Note  Patient: Dominic Osborn  Procedure(s) Performed: ESOPHAGOGASTRODUODENOSCOPY (EGD) WITH PROPOFOL ESOPHAGEAL DILATION  Patient location during evaluation: Endoscopy Anesthesia Type: General Level of consciousness: awake and alert Pain management: pain level controlled Vital Signs Assessment: post-procedure vital signs reviewed and stable Respiratory status: spontaneous breathing, nonlabored ventilation, respiratory function stable and patient connected to nasal cannula oxygen Cardiovascular status: blood pressure returned to baseline and stable Postop Assessment: no apparent nausea or vomiting Anesthetic complications: no   No notable events documented.   Last Vitals:  Vitals:   11/23/22 1153 11/23/22 1251  BP: 136/78 (!) 108/57  Pulse: (!) 54 (!) 48  Resp: 16 18  Temp: (!) 36.2 C (!) 35.9 C  SpO2: 100% 100%    Last Pain:  Vitals:   11/23/22 1302  TempSrc:   PainSc: 0-No pain                 Corinda Gubler

## 2022-11-23 NOTE — Transfer of Care (Signed)
Immediate Anesthesia Transfer of Care Note  Patient: Dominic Osborn  Procedure(s) Performed: ESOPHAGOGASTRODUODENOSCOPY (EGD) WITH PROPOFOL ESOPHAGEAL DILATION  Patient Location: PACU  Anesthesia Type:General  Level of Consciousness: sedated  Airway & Oxygen Therapy: Patient Spontanous Breathing  Post-op Assessment: Report given to RN and Post -op Vital signs reviewed and stable  Post vital signs: Reviewed and stable  Last Vitals:  Vitals Value Taken Time  BP 108/57 11/23/22 1251  Temp    Pulse 47 11/23/22 1252  Resp 19 11/23/22 1252  SpO2 99 % 11/23/22 1252  Vitals shown include unfiled device data.  Last Pain:  Vitals:   11/23/22 1153  TempSrc: Temporal  PainSc: 0-No pain         Complications: No notable events documented.

## 2022-11-23 NOTE — Interval H&P Note (Signed)
History and Physical Interval Note:  11/23/2022 12:31 PM  Dominic Osborn  has presented today for surgery, with the diagnosis of 787.20 (ICD-9-CM) - R13.10 (ICD-10-CM) - Dysphagia, unspecified type.  The various methods of treatment have been discussed with the patient and family. After consideration of risks, benefits and other options for treatment, the patient has consented to  Procedure(s): ESOPHAGOGASTRODUODENOSCOPY (EGD) WITH PROPOFOL (N/A) as a surgical intervention.  The patient's history has been reviewed, patient examined, no change in status, stable for surgery.  I have reviewed the patient's chart and labs.  Questions were answered to the patient's satisfaction.     [place], [place]

## 2022-11-23 NOTE — Anesthesia Preprocedure Evaluation (Signed)
Anesthesia Evaluation  Patient identified by MRN, date of birth, ID band Patient awake    Reviewed: Allergy & Precautions, NPO status , Patient's Chart, lab work & pertinent test results, reviewed documented beta blocker date and time   History of Anesthesia Complications Negative for: history of anesthetic complications  Airway Mallampati: II  TM Distance: >3 FB Neck ROM: Full    Dental no notable dental hx. (+) Teeth Intact   Pulmonary neg pulmonary ROS, neg sleep apnea, neg COPD, Patient abstained from smoking.Not current smoker   Pulmonary exam normal breath sounds clear to auscultation       Cardiovascular Exercise Tolerance: Good METShypertension, Pt. on medications and Pt. on home beta blockers (-) CAD, (-) Past MI and (-) CHF (-) dysrhythmias + Valvular Problems/Murmurs (Aortic Valve replacement, Aortic aneurysm repair)  Rhythm:Regular Rate:Normal - Systolic murmurs S/p AVR  Cardiac MRI 2022:  1. The left ventricle is normal in cavity size. There is mild basal sigmoid septal hypertrophy.  Regional  and global LV systolic function are normal with an LV ejection fraction calculated at 66%.   2. The right ventricle is normal in cavity size, wall thickness, and systolic function.   3. Both atria are mildly enlarged.   4. There is a well-seated bioprosthetic valve in the aortic position. The leaflets are not well visualized  due to metal artifacts. Peak aortic flow velocity is 2.5 m/s (estimated peak gradient 26 mm Hg; estimated  mean gradient 12 mm Hg).  There is no significant aortic stenosis or regurgitation.   5.  There is trivial mitral regurgitation, trivial tricuspid regurgitation, and trivial pulmonic  regurgitation.   6. Delayed enhancement imaging is abnormal. There is a small focal region of hyperenhancement in the basal  anterolateral wall, consistent with a small region of nonspecific scar (possibly embolic in  origin).     Neuro/Psych neg Seizures PSYCHIATRIC DISORDERS Anxiety     negative neurological ROS     GI/Hepatic Neg liver ROS,neg GERD  ,,  Endo/Other  neg diabetes    Renal/GU negative Renal ROS     Musculoskeletal   Abdominal   Peds  Hematology   Anesthesia Other Findings Past Medical History: No date: Anemia No date: Anxiety No date: Bleeding disorder (HCC) No date: Dysrhythmia/Atrial fib No date: Elevated PSA No date: Hypertension No date: PVC (premature ventricular contraction) No date: Thrombocytopenia (HCC) No date: Tubular adenoma  Reproductive/Obstetrics                             Anesthesia Physical Anesthesia Plan  ASA: 2  Anesthesia Plan: General   Post-op Pain Management: Minimal or no pain anticipated   Induction: Intravenous  PONV Risk Score and Plan: 2 and Propofol infusion and TIVA  Airway Management Planned: Nasal Cannula  Additional Equipment: None  Intra-op Plan:   Post-operative Plan:   Informed Consent: I have reviewed the patients History and Physical, chart, labs and discussed the procedure including the risks, benefits and alternatives for the proposed anesthesia with the patient or authorized representative who has indicated his/her understanding and acceptance.     Dental advisory given  Plan Discussed with: CRNA and Surgeon  Anesthesia Plan Comments: (Discussed risks of anesthesia with patient, including possibility of difficulty with spontaneous ventilation under anesthesia necessitating airway intervention, PONV, and rare risks such as cardiac or respiratory or neurological events, and allergic reactions. Discussed the role of CRNA in patient's perioperative care. Patient understands.)  Anesthesia Quick Evaluation

## 2022-11-23 NOTE — H&P (Signed)
Outpatient short stay form Pre-procedure 11/23/2022 12:30 PM Dominic Osborn K. Norma Fredrickson, M.D.  Primary Physician: Bethann Punches, M.D.  Reason for visit:  Dysphagia  History of present illness:  Dysphagia  Patient complains of difficulty swallowing and feels as if his food goes "down the wrong tube". It doesn't happen daily but a couple of times per month. Patient states the past few days he has had diarrhea and a lot of gas but has improved some now. Patient states he has never had an EGD before.   Dysphagia, unspecified type (primary encounter diagnosis) Change in bowel habits  GI summary: - 06/29/2012 for colonoscopy for personal history of colon polyps revealed a single solitary ulcer at the ileocecal valve this was biopsied. Internal hemorrhoids were medium size. Exam was otherwise normal. The pathology returned small bowel mucosa with focal mild active enteritis and ulceration. Potential features of chronicity are absent.  -10/01/2018: Colonoscopy: Indication: History of colon polyps: Impression: Diminutive TA polyp, diverticulosis, internal hemorrhoids. Repeat in 09/2023.     Current Facility-Administered Medications:    0.9 %  sodium chloride infusion, , Intravenous, Continuous, Konnie Noffsinger, Boykin Nearing, MD, Last Rate: 20 mL/hr at 11/23/22 1158, 1,000 mL at 11/23/22 1158  Medications Prior to Admission  Medication Sig Dispense Refill Last Dose   amLODipine (NORVASC) 5 MG tablet TAKE 1 TABLET ONE TIME DAILY   11/23/2022 at 0600   aspirin 81 MG chewable tablet Chew 81 mg by mouth daily.   11/23/2022 at 0600   calcium citrate (CALCITRATE - DOSED IN MG ELEMENTAL CALCIUM) 950 (200 Ca) MG tablet Take 200 mg of elemental calcium by mouth daily.   Past Week   calcium-vitamin D (OSCAL WITH D) 500-200 MG-UNIT tablet Take 1 tablet by mouth.   Past Week   cholecalciferol (VITAMIN D3) 10 MCG/ML LIQD oral liquid Take 1,000 Units by mouth daily.   Past Week   fluticasone (FLONASE) 50 MCG/ACT nasal spray Place 2 sprays  into both nostrils daily.   Past Week   hydrochlorothiazide (HYDRODIURIL) 25 MG tablet Take 25 mg by mouth daily.   11/23/2022 at 0600   Magnesium Oxide 500 MG CAPS Take by mouth.   Past Week   metoprolol succinate (TOPROL-XL) 25 MG 24 hr tablet Take by mouth.   11/23/2022 at 0600   Multiple Vitamin (MULTI-VITAMINS) TABS Take by mouth.   Past Week   naproxen (EC NAPROSYN) 500 MG EC tablet Take 220 mg by mouth 2 (two) times daily with a meal.   11/22/2022   pantoprazole (PROTONIX) 20 MG tablet Take 40 mg by mouth daily.   11/23/2022 at 0600   telmisartan (MICARDIS) 80 MG tablet Take 80 mg by mouth daily.   11/23/2022 at 0600   acetaminophen (TYLENOL) 650 MG CR tablet Take 650 mg by mouth every 8 (eight) hours as needed for pain.    at prn   amoxicillin (AMOXIL) 500 MG capsule Take 500 mg by mouth 3 (three) times daily.      diphenhydrAMINE (BENADRYL) 50 MG capsule Take 50 mg by mouth every 6 (six) hours as needed.    at prn     Allergies  Allergen Reactions   Quinolones     Other reaction(s): Other (See Comments) Fluroquinolone antibiotics should be avoided in patients with history of aortic aneurysm/dissection      Past Medical History:  Diagnosis Date   Anemia    Anxiety    Bleeding disorder (HCC)    Dysrhythmia/Atrial fib    Elevated PSA  Hypertension    PVC (premature ventricular contraction)    Thrombocytopenia (HCC)    Tubular adenoma     Review of systems:  Otherwise negative.    Physical Exam  Gen: Alert, oriented. Appears stated age.  HEENT: [place]/AT. PERRLA. Lungs: CTA, no wheezes. CV: RR nl S1, S2. Abd: soft, benign, no masses. BS+ Ext: No edema. Pulses 2+    Planned procedures: Proceed with Esophagogastroduodenoscopy.  The patient understands the nature of the planned procedure, indications, risks, alternatives and potential complications including but not limited to bleeding, infection, perforation, damage to internal organs and possible oversedation/side effects  from anesthesia. The patient agrees and gives consent to proceed.  Please refer to procedure notes for findings, recommendations and patient disposition/instructions.     Demetries Coia K. Norma Fredrickson, M.D. Gastroenterology 11/23/2022  12:30 PM

## 2022-11-24 ENCOUNTER — Encounter: Payer: Self-pay | Admitting: Internal Medicine

## 2023-02-23 ENCOUNTER — Other Ambulatory Visit: Payer: Self-pay | Admitting: *Deleted

## 2023-02-23 DIAGNOSIS — R972 Elevated prostate specific antigen [PSA]: Secondary | ICD-10-CM

## 2023-02-24 ENCOUNTER — Other Ambulatory Visit: Payer: Medicare HMO

## 2023-02-24 DIAGNOSIS — R972 Elevated prostate specific antigen [PSA]: Secondary | ICD-10-CM

## 2023-02-25 LAB — PSA: Prostate Specific Ag, Serum: 5.9 ng/mL — ABNORMAL HIGH (ref 0.0–4.0)

## 2023-03-03 ENCOUNTER — Ambulatory Visit: Payer: Medicare HMO | Admitting: Urology

## 2023-03-08 ENCOUNTER — Encounter: Payer: Self-pay | Admitting: Urology

## 2023-03-08 ENCOUNTER — Ambulatory Visit: Payer: Medicare HMO | Admitting: Urology

## 2023-03-08 VITALS — BP 128/70 | HR 74 | Ht 76.0 in | Wt 220.0 lb

## 2023-03-08 DIAGNOSIS — N529 Male erectile dysfunction, unspecified: Secondary | ICD-10-CM | POA: Diagnosis not present

## 2023-03-08 DIAGNOSIS — R972 Elevated prostate specific antigen [PSA]: Secondary | ICD-10-CM

## 2023-03-08 MED ORDER — TADALAFIL 20 MG PO TABS: 10.0000 mg | ORAL_TABLET | Freq: Every day | ORAL | 0 refills | Status: AC | PRN

## 2023-03-08 NOTE — Progress Notes (Signed)
I, Dominic Osborn, acting as a scribe for Dominic Altes, MD., have documented all relevant documentation on the behalf of Dominic Altes, MD, as directed by Dominic Altes, MD while in the presence of Dominic Altes, MD.  03/08/2023 2:29 PM   Dominic Osborn January 30, 1946 [id number]  Referring provider: Danella Penton, MD [phone] [facility] [address] [facility] [place],  [place] [address]  Chief Complaint  Patient presents with   Elevated PSA   Urologic history: 1.  History urethral bleeding 02/2020 Cystoscopy BPH with hypervascularity   2.  Elevated PSA 4.41 on 02/18/2021 (baseline 2.5) Repeat PSA 5.7 Prostate MRI 03/2021 88 g gland; PI-RADS 3 lesion right posterolateral and posteromedial PZ Fusion biopsy 04/2021 with negative 12 core template and 4 ROI biopsies  HPI: Dominic Osborn is a 77 y.o. male presents for annual follow-up.  Doing well since last visit No bothersome LUTS Denies dysuria, gross hematuria Denies flank, abdominal or pelvic pain PSA levels 05/23/22 were 4.9 and 5.9 on 02/24/23. PCP also checked his PSA 5/24 which was 6.52, and 03/06/23 which was 5.72 Using tadalafil 10 mg for ED and requested a refill.   PSA trend   Prostate Specific Ag, Serum  Latest Ref Rng 0.0 - 4.0 ng/mL  03/15/2021 5.7 (H)   11/18/2021 4.6 (H)   05/23/2022 4.9 (H)   02/24/2023 5.9 (H)     PMH: Past Medical History:  Diagnosis Date   Anemia    Anxiety    Bleeding disorder (HCC)    Dysrhythmia/Atrial fib    Elevated PSA    Hypertension    PVC (premature ventricular contraction)    Thrombocytopenia (HCC)    Tubular adenoma     Surgical History: Past Surgical History:  Procedure Laterality Date   AORTA ANEURYSM REPAIR     CARDIAC VALVE REPLACEMENT     COLONOSCOPY WITH PROPOFOL N/A 10/01/2018   Procedure: COLONOSCOPY WITH PROPOFOL;  Surgeon: Scot Jun, MD;  Location: [facility] ENDOSCOPY;  Service: Endoscopy;  Laterality: N/A;   ESOPHAGEAL DILATION   11/23/2022   Procedure: ESOPHAGEAL DILATION;  Surgeon: Toledo, Boykin Nearing, MD;  Location: [facility] ENDOSCOPY;  Service: Gastroenterology;;   ESOPHAGOGASTRODUODENOSCOPY (EGD) WITH PROPOFOL N/A 11/23/2022   Procedure: ESOPHAGOGASTRODUODENOSCOPY (EGD) WITH PROPOFOL;  Surgeon: Toledo, Boykin Nearing, MD;  Location: [facility] ENDOSCOPY;  Service: Gastroenterology;  Laterality: N/A;   TONSILLECTOMY      Home Medications:  Allergies as of 03/08/2023       Reactions   Quinolones    Other reaction(s): Other (See Comments) Fluroquinolone antibiotics should be avoided in patients with history of aortic aneurysm/dissection         Medication List        Accurate as of March 08, 2023  2:29 PM. If you have any questions, ask your nurse or doctor.          acetaminophen 650 MG CR tablet Commonly known as: TYLENOL Take 650 mg by mouth every 8 (eight) hours as needed for pain.   amLODipine 5 MG tablet Commonly known as: NORVASC TAKE 1 TABLET ONE TIME DAILY   amoxicillin 500 MG capsule Commonly known as: AMOXIL Take 500 mg by mouth 3 (three) times daily.   aspirin 81 MG chewable tablet Chew 81 mg by mouth daily.   calcium citrate 950 (200 Ca) MG tablet Commonly known as: CALCITRATE - dosed in mg elemental calcium Take 200 mg of elemental calcium by mouth daily.   calcium-vitamin  D 500-200 MG-UNIT tablet Commonly known as: OSCAL WITH D Take 1 tablet by mouth.   cholecalciferol 10 MCG/ML Liqd oral liquid Commonly known as: VITAMIN D3 Take 1,000 Units by mouth daily.   diphenhydrAMINE 50 MG capsule Commonly known as: BENADRYL Take 50 mg by mouth every 6 (six) hours as needed.   fluticasone 50 MCG/ACT nasal spray Commonly known as: FLONASE Place 2 sprays into both nostrils daily.   hydrochlorothiazide 25 MG tablet Commonly known as: HYDRODIURIL Take 25 mg by mouth daily.   Magnesium Oxide -Mg Supplement 500 MG Caps Take by mouth.   metoprolol succinate 25 MG 24 hr tablet Commonly  known as: TOPROL-XL Take by mouth.   Multi-Vitamins Tabs Take by mouth.   naproxen 500 MG EC tablet Commonly known as: EC NAPROSYN Take 220 mg by mouth 2 (two) times daily with a meal.   pantoprazole 20 MG tablet Commonly known as: PROTONIX Take 40 mg by mouth daily.   tadalafil 20 MG tablet Commonly known as: CIALIS Take 0.5 tablets (10 mg total) by mouth daily as needed for erectile dysfunction. Started by: Dominic Osborn   telmisartan 80 MG tablet Commonly known as: MICARDIS Take 80 mg by mouth daily.        Allergies:  Allergies  Allergen Reactions   Quinolones     Other reaction(s): Other (See Comments) Fluroquinolone antibiotics should be avoided in patients with history of aortic aneurysm/dissection     Family History: Family History  Problem Relation Age of Onset   Bladder Cancer Neg Hx    Prostate cancer Neg Hx    Kidney cancer Neg Hx     Social History:  reports that he has never smoked. He has never used smokeless tobacco. He reports that he does not currently use alcohol. He reports that he does not use drugs.   Physical Exam: BP 128/70   Pulse 74   Ht 6\' 4"  (1.93 m)   Wt 220 lb (99.8 kg)   BMI 26.78 kg/m   Constitutional:  Alert and oriented, No acute distress. HEENT: [place] AT, moist mucus membranes.  Trachea midline, no masses. Cardiovascular: No clubbing, cyanosis, or edema. Respiratory: Normal respiratory effort, no increased work of breathing. GI: Abdomen is soft, nontender, nondistended, no abdominal masses Skin: No rashes, bruises or suspicious lesions. Neurologic: Grossly intact, no focal deficits, moving all 4 extremities. Psychiatric: Normal mood and affect.   Assessment & Plan:    1. Elevated PSA. Stable PSA Benign fusion biopsy January 2023- PSA 5.7 1 year follow-up with me.  Dr. Hyacinth Meeker is checking his PSA Q6 months.  2. Erectile dysfunction Stable Rx tadalafil 10 mg sent to pharmacy.   I have reviewed the above  documentation for accuracy and completeness, and I agree with the above.   Dominic Altes, MD  [facility] [address], [address] [place], [place] [address] [phone]

## 2023-03-27 ENCOUNTER — Ambulatory Visit: Payer: Medicare HMO | Admitting: Urology

## 2023-11-04 IMAGING — MR MR PROSTATE WO/W CM
56 series · 56 of 56 positions shown · IV contrast (9ml Gadavist)
Comparison: None.

CLINICAL DATA: Elevated PSA level of 5.7 three weeks ago.

EXAM:
MR PROSTATE WITHOUT AND WITH CONTRAST
TECHNIQUE: Multiplanar multisequence MRI images were obtained of the pelvis
centered about the prostate. Pre and post contrast images were
obtained.
CONTRAST:  9mL GADAVIST GADOBUTROL 1 MMOL/ML IV SOLN

[Series 5: T2 · coronal · 3.0mm · 0.70mm/px · 1 of 35 slices shown (1 of 3)]
[im 1/35]
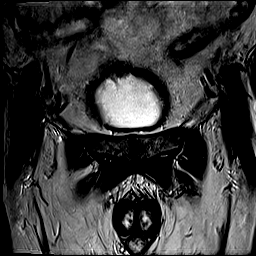

[Series 6: ax in&out whole · axial · 3.0mm · 1.19mm/px · 1 of 88 slices shown (1 of 2)]
[im 1/88]
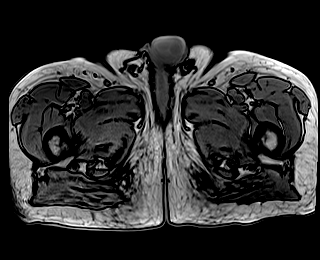

[Series 7: ax in&out whole · axial · 3.0mm · 1.19mm/px · 1 of 88 slices shown (2 of 2)]
[im 1/88]
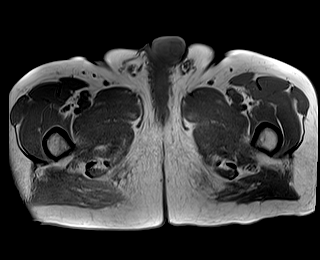

[Series 8: T2 · axial · 3.0mm · 0.56mm/px · 1 of 35 slices shown (2 of 3)]
[im 1/35]
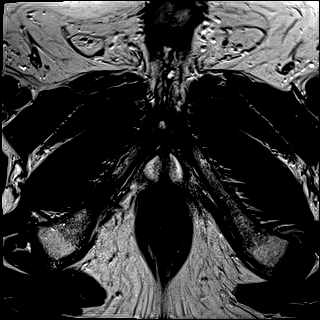

[Series 9: DWI · axial · 3.0mm · 0.86mm/px · 1 of 75 slices shown (1 of 3)]
[im 1/75]
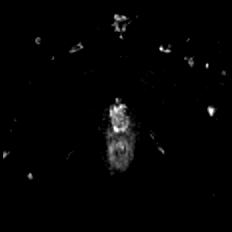

[Series 10: DWI · axial · 3.0mm · 0.86mm/px · 1 of 25 slices shown (2 of 3)]
[im 1/25]
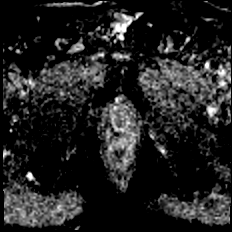

[Series 11: DWI · axial · 3.0mm · 0.86mm/px · 1 of 25 slices shown (3 of 3)]
[im 1/25]
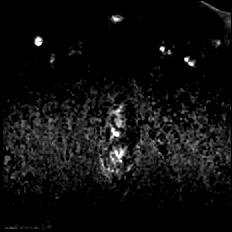

[Series 12: T2 · axial · 1.0mm · 1.04mm/px · 1 of 72 slices shown (3 of 3)]
[im 1/72]
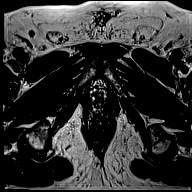

[Series 13: T1 · axial · 3.0mm · 1.15mm/px · 1 of 28 slices shown (1 of 48)]
[im 1/28]
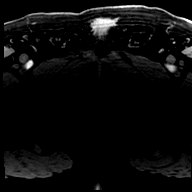

[Series 14: T1 · axial · 3.0mm · 1.15mm/px · 1 of 28 slices shown (2 of 48)]
[im 1/28]
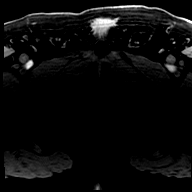

[Series 15: T1 · axial · 3.0mm · 1.15mm/px · 1 of 28 slices shown (3 of 48)]
[im 1/28]
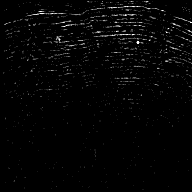

[Series 16: T1 · axial · 3.0mm · 1.15mm/px · 1 of 28 slices shown (4 of 48)]
[im 1/28]
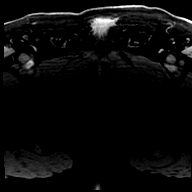

[Series 17: T1 · axial · 3.0mm · 1.15mm/px · 1 of 28 slices shown (5 of 48)]
[im 1/28]
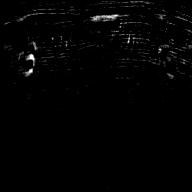

[Series 18: T1 · axial · 3.0mm · 1.15mm/px · 1 of 28 slices shown (6 of 48)]
[im 1/28]
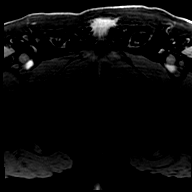

[Series 19: T1 · axial · 3.0mm · 1.15mm/px · 1 of 28 slices shown (7 of 48)]
[im 1/28]
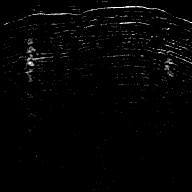

[Series 20: T1 · axial · 3.0mm · 1.15mm/px · 1 of 28 slices shown (8 of 48)]
[im 1/28]
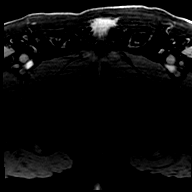

[Series 21: T1 · axial · 3.0mm · 1.15mm/px · 1 of 28 slices shown (9 of 48)]
[im 1/28]
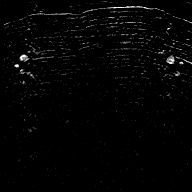

[Series 22: T1 · axial · 3.0mm · 1.15mm/px · 1 of 28 slices shown (10 of 48)]
[im 1/28]
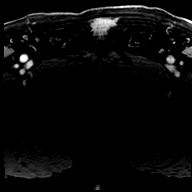

[Series 23: T1 · axial · 3.0mm · 1.15mm/px · 1 of 28 slices shown (11 of 48)]
[im 1/28]
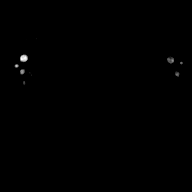

[Series 24: T1 · axial · 3.0mm · 1.15mm/px · 1 of 28 slices shown (12 of 48)]
[im 1/28]
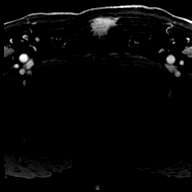

[Series 25: T1 · axial · 3.0mm · 1.15mm/px · 1 of 28 slices shown (13 of 48)]
[im 1/28]
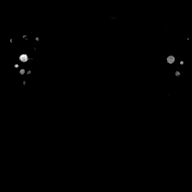

[Series 26: T1 · axial · 3.0mm · 1.15mm/px · 1 of 28 slices shown (14 of 48)]
[im 1/28]
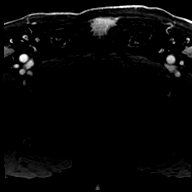

[Series 27: T1 · axial · 3.0mm · 1.15mm/px · 1 of 28 slices shown (15 of 48)]
[im 1/28]
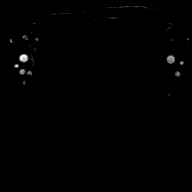

[Series 28: T1 · axial · 3.0mm · 1.15mm/px · 1 of 28 slices shown (16 of 48)]
[im 1/28]
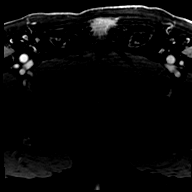

[Series 29: T1 · axial · 3.0mm · 1.15mm/px · 1 of 28 slices shown (17 of 48)]
[im 1/28]
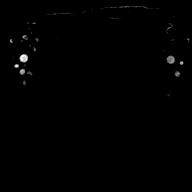

[Series 30: T1 · axial · 3.0mm · 1.15mm/px · 1 of 28 slices shown (18 of 48)]
[im 1/28]
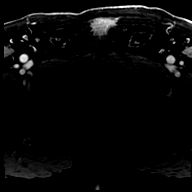

[Series 31: T1 · axial · 3.0mm · 1.15mm/px · 1 of 28 slices shown (19 of 48)]
[im 1/28]
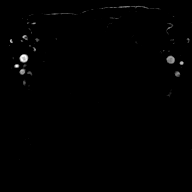

[Series 32: T1 · axial · 3.0mm · 1.15mm/px · 1 of 28 slices shown (20 of 48)]
[im 1/28]
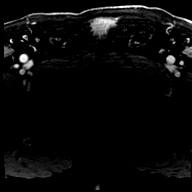

[Series 33: T1 · axial · 3.0mm · 1.15mm/px · 1 of 28 slices shown (21 of 48)]
[im 1/28]
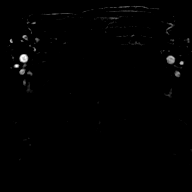

[Series 34: T1 · axial · 3.0mm · 1.15mm/px · 1 of 28 slices shown (22 of 48)]
[im 1/28]
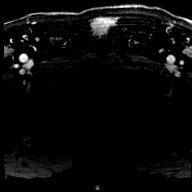

[Series 35: T1 · axial · 3.0mm · 1.15mm/px · 1 of 24 slices shown (23 of 48)]
[im 1/24]
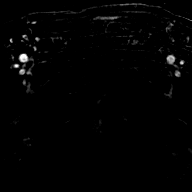

[Series 36: T1 · axial · 3.0mm · 1.15mm/px · 1 of 28 slices shown (24 of 48)]
[im 1/28]
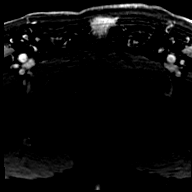

[Series 37: T1 · axial · 3.0mm · 1.15mm/px · 1 of 20 slices shown (25 of 48)]
[im 1/20]
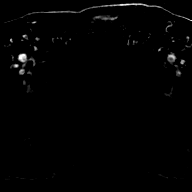

[Series 38: T1 · axial · 3.0mm · 1.15mm/px · 1 of 28 slices shown (26 of 48)]
[im 1/28]
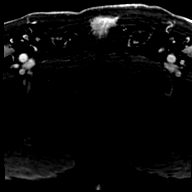

[Series 39: T1 · axial · 3.0mm · 1.15mm/px · 1 of 21 slices shown (27 of 48)]
[im 1/21]
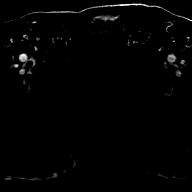

[Series 40: T1 · axial · 3.0mm · 1.15mm/px · 1 of 28 slices shown (28 of 48)]
[im 1/28]
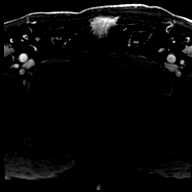

[Series 41: T1 · axial · 3.0mm · 1.15mm/px · 1 of 23 slices shown (29 of 48)]
[im 1/23]
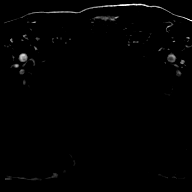

[Series 42: T1 · axial · 3.0mm · 1.15mm/px · 1 of 28 slices shown (30 of 48)]
[im 1/28]
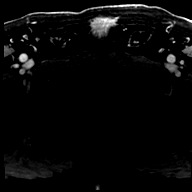

[Series 43: T1 · axial · 3.0mm · 1.15mm/px · 1 of 27 slices shown (31 of 48)]
[im 1/27]
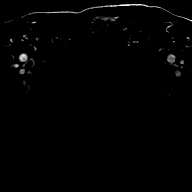

[Series 44: T1 · axial · 3.0mm · 1.15mm/px · 1 of 28 slices shown (32 of 48)]
[im 1/28]
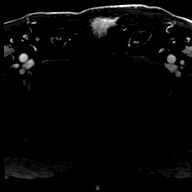

[Series 45: T1 · axial · 3.0mm · 1.15mm/px · 1 of 27 slices shown (33 of 48)]
[im 1/27]
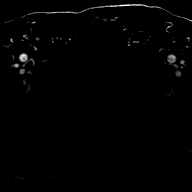

[Series 46: T1 · axial · 3.0mm · 1.15mm/px · 1 of 28 slices shown (34 of 48)]
[im 1/28]
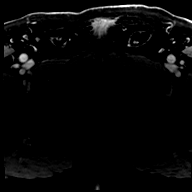

[Series 47: T1 · axial · 3.0mm · 1.15mm/px · 1 of 28 slices shown (35 of 48)]
[im 1/28]
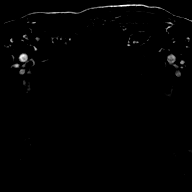

[Series 48: T1 · axial · 3.0mm · 1.15mm/px · 1 of 28 slices shown (36 of 48)]
[im 1/28]
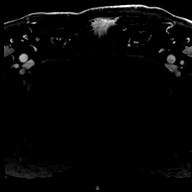

[Series 49: T1 · axial · 3.0mm · 1.15mm/px · 1 of 28 slices shown (37 of 48)]
[im 1/28]
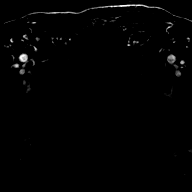

[Series 50: T1 · axial · 3.0mm · 1.15mm/px · 1 of 28 slices shown (38 of 48)]
[im 1/28]
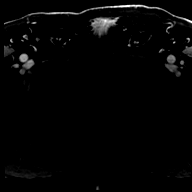

[Series 51: T1 · axial · 3.0mm · 1.15mm/px · 1 of 28 slices shown (39 of 48)]
[im 1/28]
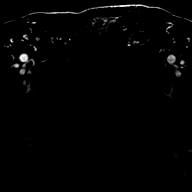

[Series 52: T1 · axial · 3.0mm · 1.15mm/px · 1 of 28 slices shown (40 of 48)]
[im 1/28]
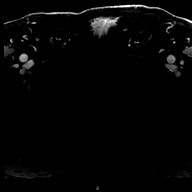

[Series 53: T1 · axial · 3.0mm · 1.15mm/px · 1 of 27 slices shown (41 of 48)]
[im 1/27]
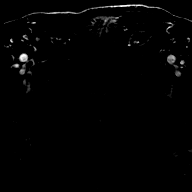

[Series 54: T1 · axial · 3.0mm · 1.15mm/px · 1 of 28 slices shown (42 of 48)]
[im 1/28]
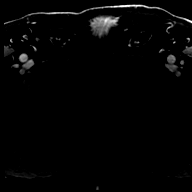

[Series 55: T1 · axial · 3.0mm · 1.15mm/px · 1 of 27 slices shown (43 of 48)]
[im 1/27]
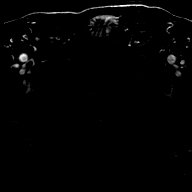

[Series 56: T1 · axial · 3.0mm · 1.15mm/px · 1 of 28 slices shown (44 of 48)]
[im 1/28]
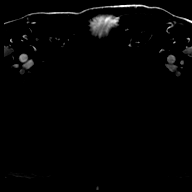

[Series 57: T1 · axial · 3.0mm · 1.15mm/px · 1 of 28 slices shown (45 of 48)]
[im 1/28]
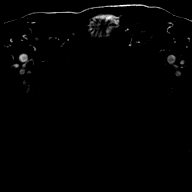

[Series 58: T1 · axial · 3.0mm · 1.15mm/px · 1 of 28 slices shown (46 of 48)]
[im 1/28]
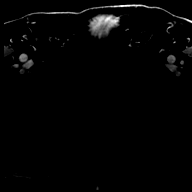

[Series 59: T1 · axial · 3.0mm · 1.15mm/px · 1 of 28 slices shown (47 of 48)]
[im 1/28]
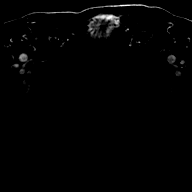

[Series 60: T1 · axial · 3.0mm · 1.15mm/px · 1 of 28 slices shown (48 of 48)]
[im 1/28]
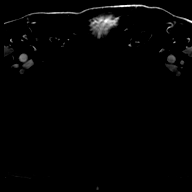

[56 of 56 positions shown; findings below may reference images not displayed]

FINDINGS: Prostate:

Hazy low T2 signal stranding in the peripheral zone is likely
postinflammatory and considered PI-RADS category 2.

Region of interest # 1: Small PI-RADS category 3 lesion of the right
posterolateral and posteromedial peripheral zone in the apex but
near the mid gland, focally reduced T2 signal but no restricted
diffusion or early enhancement. This measures 0.34 cc (1.5 by 0.5 by
0.8 cm) and is shown for example on image 51 series 12.

Encapsulated nodularity in the transition zone compatible with
benign prostatic hypertrophy.

Volume: 3D volumetric analysis: Prostate volume 87.57 cc (6.0 by
by 5.9 cm).

Transcapsular spread:  Absent

Seminal vesicle involvement: Absent

Neurovascular bundle involvement: Absent

Pelvic adenopathy: Absent

Bone metastasis: Absent

Other findings: Scattered sigmoid colon diverticula.
IMPRESSION: 1. Small PI-RADS category 3 lesion of the right peripheral zone near
the apex. Targeting data sent to [facility].
2. Benign prostatic hypertrophy and prostatomegaly.
3. Scattered sigmoid colon diverticula.

## 2023-12-29 ENCOUNTER — Encounter: Payer: Self-pay | Admitting: Internal Medicine

## 2024-01-31 ENCOUNTER — Encounter: Payer: Self-pay | Admitting: Internal Medicine

## 2024-02-13 ENCOUNTER — Ambulatory Visit: Admit: 2024-02-13 | Admitting: Internal Medicine

## 2024-02-13 HISTORY — DX: Personal history of other diseases of the circulatory system: Z86.79

## 2024-02-13 SURGERY — COLONOSCOPY
Anesthesia: General

## 2024-02-20 NOTE — Progress Notes (Signed)
 Referring Provider: Wilburn Keller Grist, Md [address] [place],  [place] [address]  Primary Care Provider: Cleotilde Oneil Novel, MD [address] [facility] [address] [facility] [place] [address]     Patient Identification and History of Present Illness  Dominic Osborn is a 78 y.o. male with a history of persistent Afib recently diagnosed in October but most recent EKG before that was 2019.  He has been experiencing HF symptoms for some time though.  Since diagnosis and with diuresis he has been doing much better. He is scheduled for TEE/DCCV in two days.  LVEF is normal and HF symptoms likely from Afib and loss of atrial function.  Today we discussed the risk, benefits, and alternatives of DCCV only, AADs, and PVI.     Problem List    Patient Active Problem List  Diagnosis  . PVC (premature ventricular contraction)  . Essential hypertension  . Medicare annual wellness visit, initial  . S/P aortic valve replacement  . Hx of ascending aorta repair  . Thrombocytopenia ()  . History of atrial fibrillation  . Tubular adenoma  . Abnormal PSA  . Chronic atrial fibrillation (CMS/HHS-HCC)      Allergies    Allergies  Allergen Reactions  . Quinolones Other (See Comments)    Fluroquinolone antibiotics should be avoided in patients with history of aortic aneurysm/dissection      Medications    Current Outpatient Medications  Medication Sig Dispense Refill  . amLODIPine (NORVASC) 10 MG tablet Take 1 tablet (10 mg total) by mouth at bedtime    . amoxicillin (AMOXIL) 500 MG capsule Take 2,000 mg by mouth 1 hour prior to Dental Procedures    . amoxicillin-clavulanate (AUGMENTIN) 875-125 mg tablet Take 1 tablet (875 mg total) by mouth every 12 (twelve) hours for 7 days 14 tablet 0  . apixaban (ELIQUIS) 5 mg tablet Take 1 tablet (5 mg total) by mouth every 12 (twelve) hours 60 tablet 11  . aspirin 81 MG EC tablet Take 81 mg by mouth once daily    . calcium  citrate (CALCITRATE) 200 mg (950 mg) tablet Take 1 tablet by mouth once daily    . calcium citrate/vitamin D3 (CALCIUM CITRATE + D ORAL) Take 1 tablet by mouth every morning    . cholecalciferol (VITAMIN D3) 1,000 unit capsule Take 1,000 Units by mouth once daily    . diphenhydrAMINE (BENADRYL) 50 MG capsule Take 50 mg by mouth nightly as needed for Sleep Takes 50 mg nightly      . magnesium oxide 500 mg Tab Take 500 mg by mouth once daily       . metoprolol  SUCCinate (TOPROL -XL) 50 MG XL tablet 1 pill a.m., 1/2 pill p.m.    SABRA multivit-mins no.63/iron/folic (M-VIT ORAL) Take by mouth With iron one at bedtime    . pantoprazole (PROTONIX) 40 MG DR tablet Take 1 tablet (40 mg total) by mouth 2 (two) times daily before meals 180 tablet 1  . potassium gluconate 600 mg (99 mg) Tab Take 1 tablet by mouth once daily    . tadalafiL  (CIALIS ) 10 MG tablet Take 1 tablet (10 mg total) by mouth once daily as needed for Erectile Dysfunction for up to 180 days Take dose 30-45 min prior to anticipated sexual activity. 90 tablet 1  . telmisartan (MICARDIS) 40 MG tablet Take 80 mg by mouth once daily    . TORsemide (DEMADEX) 10 MG tablet Take 0.5 tablets (5 mg total) by mouth  once daily     No current facility-administered medications for this visit.     Social History    Social History   Socioeconomic History  . Marital status: Widowed  Tobacco Use  . Smoking status: Never    Passive exposure: Never  . Smokeless tobacco: Never  Vaping Use  . Vaping status: Never Used  Substance and Sexual Activity  . Alcohol use: No  . Drug use: No  . Sexual activity: Defer  Social History Narrative   Lives alone in town.Retired.   Social Drivers of Corporate Investment Banker Strain: Low Risk  (09/14/2023)   Overall Financial Resource Strain (CARDIA)   . Difficulty of Paying Living Expenses: Not hard at all  Food Insecurity: No Food Insecurity (09/14/2023)   Hunger Vital Sign   . Worried About Brewing Technologist in the Last Year: Never true   . Ran Out of Food in the Last Year: Never true  Transportation Needs: No Transportation Needs (09/14/2023)   PRAPARE - Transportation   . Lack of Transportation (Medical): No   . Lack of Transportation (Non-Medical): No  Housing Stability: Low Risk  (09/14/2023)   Housing Stability Vital Sign   . Unable to Pay for Housing in the Last Year: No   . Number of Times Moved in the Last Year: 0   . Homeless in the Last Year: No     Family History    Family History  Problem Relation Name Age of Onset  . Osteoporosis (Thinning of bones) Mother    . No Known Problems Father    . Colon cancer Neg Hx       Review of Systems     GENERAL HEALTH/CONSTITUTIONAL []  Appetite change []  Fevers or chills []  Fatigue []  Unexpected weight gain/loss []  Reduced activity  EYES, EARS, NOSE, THROAT []  Vision changes (blurred, double vision) []  Hearing changes / loss []  Tinnitus / ringing in ears []  Dental problem (dentition / gums) []  Vertigo / dizziness []  Nose bleeds []  Voice changes []  Trouble swallowing  RESPIRATORY []  Cough [x]  Shortness of breath []  Sleep apnea []  Snoring []  Sputum []  Wheezing  CARDIOVASCULAR []  Chest pain, pressure, or tightness [x]  Palpitations/heart flutters []  Near passing out []  Passing out [x]  Leg / ankle swelling []  Waking up short of breath []  Sleeping on an incline/more than one pillow []  Leg pain / cramps with walking  GASTROINTESTINAL []  Abdominal bloating/swelling []  Abdominal pain []  Blood on toilet tissue, in bowl, in stool []  Black, tarry stool []  Constipation []  Diarrhea []  Nausea []  Vomiting []  Heartburn symptoms URINARY []  Blood in urine []  Excessive urination []  Frequent urination []  Difficulty urinating []  Flank pain / painful urination []  Excess sweating / thirst []  Nocturia need to urinate more then 1 time at night []  Decreased amount of urine  MUSCULOSKELETAL [x]  Joint  aches/pains []  Swollen joints []  Back pain []  Muscle aches / pains  SKIN []  Color change []  Rash []  Wound  NEUROLOGIC []  Memory Changes []  Tremors / problems with balance []  Weakness []  Tingling / paresthesias []  Numbness []  Headaches []  Speech difficulty  HEMATOLOGIC []  History of blood clots []  Bruises/bleeds easily  PSYCHIATRIC []  Anxiety / Stress []  Depression []  Sleep disturbance  ENDOCRINE []  Thyroid  problems []  Heat intolerance []  Cold Intolerance      Physical Exam   Vitals: BP (!) 144/72   Pulse 71   Ht 193 cm (6' 4)   Wt (!) 101.3 kg (  223 lb 6.4 oz)   SpO2 97%   BMI 27.19 kg/m   General Appearance:  alert, cooperative, in NAD  HEENT: oropharynx clear, moist mucous membranes  Neurologic: motor and sensory grossly normal bilaterally   Psych: oriented to time, place and person, mood and affect are appropriate  Neck: supple, no significant adenopathy, no jugular venous distension, no bruits  Lungs:   clear to auscultation with good air movement, no rales bilaterally, unlabored breathing   Heart:  irregularly irregular rhythm with rate 70  Abdomen:   soft, nontender, nondistended, normoactive bowel sounds   Extremities: no pedal edema noted, warm   Musculoskeletal: Moves all extremities. Ambulates without Difficulty   Skin: No rashes or ulcers   Cardiac Data   12 lead ECG:  I personally ordered, reviewed, and interpreted the ECG performed on 02/20/2024 showing: rhythm: atrial fibrillation,  Results for orders placed or performed in visit on 02/20/24  ECG 12-lead   Collection Time: 02/20/24  3:10 PM  Result Value Ref Range   Vent Rate (bpm) 65    QRS Interval (msec) 96    QT Interval (msec) 414    QTc (msec) 430      Review and Summary of Prior Cardiac Studies: 01/2024 Echo:  CONCLUSION ------------------------------------------------------------------------------- NORMAL LEFT VENTRICULAR SYSTOLIC FUNCTION WITH NO LVH, LV SIZE IS MILDLY  ENLARGED ESTIMATED EF: >55% INDETERMINATE DIASTOLIC FUNCTION NORMAL RIGHT VENTRICULAR SYSTOLIC FUNCTION VALVULAR REGURGITATION: TRIVIAL AR, MODERATE MR, TRIVIAL PR, MODERATE TR                    ESTIMATED RVSP: 60 mmHg (ABNORMAL) VALVULAR STENOSIS: BIOPROSTHETIC AoV, No MS, No PS, No TS      Assessment and Plan  Dominic Osborn is a 78 y.o. male with a history of persistent Afib recently diagnosed in October but most recent EKG before that was 2019.  He has been experiencing HF symptoms for some time though.  Since diagnosis and with diuresis he has been doing much better. He is scheduled for TEE/DCCV in two days.  LVEF is normal and HF symptoms likely from Afib and loss of atrial function.  Today we discussed the risk, benefits, and alternatives of DCCV only, AADs, and PVI.   Follow up in two months, he will see how he does after DCCV and determine how he wants to proceed.    Future Appointments     Date/Time Provider Department Center Visit Type   03/12/2024 8:30 AM [facility] WEST LAB [facility] C LAB   03/19/2024 1:30 PM Cleotilde Oneil Novel, MD [facility] MARYL BROCKS [facility] OFFICE VISIT   03/28/2024 10:45 AM Alluri, Keller Grist, MD [facility] C FOLLOW UP       Orders Placed This Encounter  Procedures  . ECG 12-lead     I spent a total of 45 minutes in both face-to-face and non-face-to-face activities, excluding procedures performed, for this visit on the date of this encounter.

## 2024-02-21 MED ORDER — SODIUM CHLORIDE 0.9 % IV SOLN: INTRAVENOUS | Status: DC

## 2024-02-22 ENCOUNTER — Ambulatory Visit
Admission: RE | Admit: 2024-02-22 | Discharge: 2024-02-22 | Disposition: A | Discharge status: Home / Self Care | Attending: Cardiology | Admitting: Cardiology

## 2024-02-22 ENCOUNTER — Other Ambulatory Visit: Payer: Self-pay

## 2024-02-22 ENCOUNTER — Ambulatory Visit: Admitting: Anesthesiology

## 2024-02-22 ENCOUNTER — Encounter: Payer: Self-pay | Admitting: Cardiology

## 2024-02-22 ENCOUNTER — Encounter: Admission: RE | Disposition: A | Payer: Self-pay | Source: Home / Self Care | Attending: Cardiology

## 2024-02-22 ENCOUNTER — Ambulatory Visit
Admission: RE | Admit: 2024-02-22 | Discharge: 2024-02-22 | Disposition: A | Discharge status: Home / Self Care | Source: Home / Self Care | Attending: Student | Admitting: Student

## 2024-02-22 DIAGNOSIS — I110 Hypertensive heart disease with heart failure: Secondary | ICD-10-CM | POA: Insufficient documentation

## 2024-02-22 DIAGNOSIS — R001 Bradycardia, unspecified: Secondary | ICD-10-CM | POA: Diagnosis not present

## 2024-02-22 DIAGNOSIS — I4891 Unspecified atrial fibrillation: Secondary | ICD-10-CM | POA: Diagnosis not present

## 2024-02-22 DIAGNOSIS — Z79899 Other long term (current) drug therapy: Secondary | ICD-10-CM | POA: Diagnosis not present

## 2024-02-22 DIAGNOSIS — I5030 Unspecified diastolic (congestive) heart failure: Secondary | ICD-10-CM | POA: Insufficient documentation

## 2024-02-22 DIAGNOSIS — Z7901 Long term (current) use of anticoagulants: Secondary | ICD-10-CM | POA: Insufficient documentation

## 2024-02-22 DIAGNOSIS — I4819 Other persistent atrial fibrillation: Secondary | ICD-10-CM | POA: Insufficient documentation

## 2024-02-22 DIAGNOSIS — I340 Nonrheumatic mitral (valve) insufficiency: Secondary | ICD-10-CM | POA: Diagnosis not present

## 2024-02-22 DIAGNOSIS — I272 Pulmonary hypertension, unspecified: Secondary | ICD-10-CM | POA: Diagnosis not present

## 2024-02-22 DIAGNOSIS — Z952 Presence of prosthetic heart valve: Secondary | ICD-10-CM | POA: Diagnosis not present

## 2024-02-22 HISTORY — PX: TEE WITHOUT CARDIOVERSION: SHX5443

## 2024-02-22 HISTORY — PX: CARDIOVERSION: SHX1299

## 2024-02-22 MED ORDER — OXYCODONE HCL 5 MG PO TABS: 5.0000 mg | ORAL_TABLET | Freq: Once | ORAL | Status: DC | PRN

## 2024-02-22 MED ORDER — FENTANYL CITRATE (PF) 100 MCG/2ML IJ SOLN: 25.0000 ug | INTRAMUSCULAR | Status: DC | PRN

## 2024-02-22 MED ORDER — ACETAMINOPHEN 10 MG/ML IV SOLN: 1000.0000 mg | Freq: Once | INTRAVENOUS | Status: DC | PRN

## 2024-02-22 MED ORDER — PROPOFOL 10 MG/ML IV BOLUS
INTRAVENOUS | Status: DC | PRN
  Administered 2024-02-22: 20 mg via INTRAVENOUS
  Administered 2024-02-22 (×3): 10 mg via INTRAVENOUS
  Administered 2024-02-22 (×2): 40 mg via INTRAVENOUS
  Administered 2024-02-22: 10 mg via INTRAVENOUS

## 2024-02-22 MED ORDER — DEXMEDETOMIDINE HCL IN NACL 80 MCG/20ML IV SOLN
INTRAVENOUS | Status: DC | PRN
  Administered 2024-02-22 (×2): 8 ug via INTRAVENOUS
  Administered 2024-02-22: 4 ug via INTRAVENOUS

## 2024-02-22 MED ORDER — DROPERIDOL 2.5 MG/ML IJ SOLN: 0.6250 mg | Freq: Once | INTRAMUSCULAR | Status: DC | PRN

## 2024-02-22 MED ORDER — OXYCODONE HCL 5 MG/5ML PO SOLN: 5.0000 mg | Freq: Once | ORAL | Status: DC | PRN

## 2024-02-22 NOTE — Anesthesia Preprocedure Evaluation (Signed)
 Anesthesia Evaluation  Patient identified by MRN, date of birth, ID band Patient awake    Reviewed: Allergy & Precautions, H&P , NPO status , Patient's Chart, lab work & pertinent test results, reviewed documented beta blocker date and time   Airway Mallampati: II   Neck ROM: full    Dental  (+) Poor Dentition   Pulmonary neg pulmonary ROS   Pulmonary exam normal        Cardiovascular Exercise Tolerance: Good hypertension, On Medications (-) CHF, (-) Orthopnea and (-) PND + dysrhythmias Atrial Fibrillation + Valvular Problems/Murmurs AS  Rhythm:regular Rate:Normal     Neuro/Psych   Anxiety     negative neurological ROS  negative psych ROS   GI/Hepatic negative GI ROS, Neg liver ROS,,,  Endo/Other  negative endocrine ROS    Renal/GU negative Renal ROS  negative genitourinary   Musculoskeletal   Abdominal   Peds  Hematology  (+) Blood dyscrasia, anemia   Anesthesia Other Findings Past Medical History: No date: Anemia No date: Anxiety No date: Bleeding disorder No date: Dysrhythmia/Atrial fib No date: Elevated PSA No date: History of atrial fibrillation No date: Hypertension No date: PVC (premature ventricular contraction) No date: Thrombocytopenia No date: Tubular adenoma Past Surgical History: No date: AORTA ANEURYSM REPAIR No date: CARDIAC VALVE REPLACEMENT 10/01/2018: COLONOSCOPY WITH PROPOFOL ; N/A     Comment:  Procedure: COLONOSCOPY WITH PROPOFOL ;  Surgeon: Viktoria Lamar DASEN, MD;  Location: [facility] ENDOSCOPY;  Service:               Endoscopy;  Laterality: N/A; 11/23/2022: ESOPHAGEAL DILATION     Comment:  Procedure: ESOPHAGEAL DILATION;  Surgeon: [name],               Teodoro K, MD;  Location: [facility] ENDOSCOPY;  Service:               Gastroenterology;; 11/23/2022: ESOPHAGOGASTRODUODENOSCOPY (EGD) WITH PROPOFOL ; N/A     Comment:  Procedure: ESOPHAGOGASTRODUODENOSCOPY (EGD) WITH                PROPOFOL ;  Surgeon: Toledo, Ladell POUR, MD;  Location:               [facility] ENDOSCOPY;  Service: Gastroenterology;  Laterality:               N/A; No date: TONSILLECTOMY BMI    Body Mass Index: 26.83 kg/m     Reproductive/Obstetrics negative OB ROS                              Anesthesia Physical Anesthesia Plan  ASA: 4  Anesthesia Plan: General   Post-op Pain Management:    Induction:   PONV Risk Score and Plan: 2  Airway Management Planned:   Additional Equipment:   Intra-op Plan:   Post-operative Plan:   Informed Consent: I have reviewed the patients History and Physical, chart, labs and discussed the procedure including the risks, benefits and alternatives for the proposed anesthesia with the patient or authorized representative who has indicated his/her understanding and acceptance.     Dental Advisory Given  Plan Discussed with: CRNA  Anesthesia Plan Comments:         Anesthesia Quick Evaluation

## 2024-02-22 NOTE — Progress Notes (Signed)
*  PRELIMINARY

## 2024-02-22 NOTE — Procedures (Signed)
 Electrical Cardioversion Procedure Note  Indication: Atrial Fibrillation  Procedure Details: Consent: Indication, Risk/benefits of procedure as well as the alternatives explained to patient and informed consent obtained. Time out performed. Verified patient identification, verified procedure, verified correct patient position, special equipment/implants available, medications/allergies/relevent history reviewed, required imaging and test results reviewed.  Deep sedation was provided by anesthesia with propofol . Patient was delivered with 200 Joules of electricity X 1 with success to Sinus rhythm. Patient tolerated the procedure well. No immediate complication noted.   Successful cardioversion  Keller Paterson, MD [facility] Cardiology- [facility]

## 2024-02-22 NOTE — Progress Notes (Signed)
*  PRELIMINARY RESULTS* Echocardiogram Echocardiogram Transesophageal has been performed.  Floydene Harder 02/22/2024, 1:22 PM

## 2024-02-22 NOTE — Transfer of Care (Signed)
 Immediate Anesthesia Transfer of Care Note  Patient: Dominic Osborn  Procedure(s) Performed: ECHOCARDIOGRAM, TRANSESOPHAGEAL CARDIOVERSION  Patient Location: PACU  Anesthesia Type:General  Level of Consciousness: sedated  Airway & Oxygen Therapy: Patient Spontanous Breathing  Post-op Assessment: Report given to RN and Post -op Vital signs reviewed and stable  Post vital signs: Reviewed and stable  Last Vitals:  Vitals Value Taken Time  BP    Temp    Pulse    Resp    SpO2      Last Pain:  Vitals:   02/22/24 1146  PainSc: 0-No pain         Complications: No notable events documented.

## 2024-02-22 NOTE — Progress Notes (Addendum)
*  PRELIMINARY

## 2024-02-22 NOTE — Anesthesia Postprocedure Evaluation (Signed)
 Anesthesia Post Note  Patient: Dominic Osborn  Procedure(s) Performed: ECHOCARDIOGRAM, TRANSESOPHAGEAL CARDIOVERSION  Patient location during evaluation: PACU Anesthesia Type: General Level of consciousness: awake and alert Pain management: pain level controlled Vital Signs Assessment: post-procedure vital signs reviewed and stable Respiratory status: spontaneous breathing, nonlabored ventilation, respiratory function stable and patient connected to nasal cannula oxygen Cardiovascular status: blood pressure returned to baseline and stable Postop Assessment: no apparent nausea or vomiting Anesthetic complications: no   No notable events documented.   Last Vitals:  Vitals:   02/22/24 1239 02/22/24 1240  BP:    Pulse: (!) 51 (!) 48  Resp: (!) 25 (!) 24  SpO2: 96% 97%    Last Pain:  Vitals:   02/22/24 1146  PainSc: 0-No pain                 Lynwood KANDICE Clause

## 2024-02-23 ENCOUNTER — Encounter: Payer: Self-pay | Admitting: Cardiology

## 2024-02-23 LAB — ECHO TEE
AV Mean grad: 6 mmHg
AV Peak grad: 13.2 mmHg
Ao pk vel: 1.82 m/s

## 2024-03-06 ENCOUNTER — Other Ambulatory Visit: Payer: Self-pay

## 2024-03-06 DIAGNOSIS — R972 Elevated prostate specific antigen [PSA]: Secondary | ICD-10-CM

## 2024-03-07 LAB — PSA: Prostate Specific Ag, Serum: 5.2 ng/mL — ABNORMAL HIGH (ref 0.0–4.0)

## 2024-03-08 ENCOUNTER — Ambulatory Visit: Payer: Self-pay | Admitting: Urology

## 2024-03-08 ENCOUNTER — Encounter: Payer: Self-pay | Admitting: Urology

## 2024-03-08 VITALS — BP 162/98 | HR 67 | Ht 76.0 in | Wt 218.0 lb

## 2024-03-08 DIAGNOSIS — R972 Elevated prostate specific antigen [PSA]: Secondary | ICD-10-CM

## 2024-03-08 NOTE — Progress Notes (Signed)
 03/08/2024 9:39 AM   Dominic Osborn 08-Nov-1945 [id number]  Referring provider: Cleotilde Oneil FALCON, MD [phone] [facility] [address] [facility] [place],  [place] [address]  Chief Complaint  Patient presents with   Elevated PSA   Urologic history: 1.  History urethral bleeding 02/2020 Cystoscopy BPH with hypervascularity   2.  Elevated PSA 4.41 on 02/18/2021 (baseline 2.5) Repeat PSA 5.7 Prostate MRI 03/2021 88 g gland; PI-RADS 3 lesion right posterolateral and posteromedial PZ Fusion biopsy 04/2021 with negative 12 core template and 4 ROI biopsies  HPI: Dominic Osborn is a 78 y.o. male presents for annual follow-up  No problems since last year's visit No bothersome LUTS Denies dysuria, gross hematuria No flank, abdominal or pelvic pain PSA 03/06/2024 stable at 5.2  PSA trend    Prostate Specific Ag, Serum  Latest Ref Rng 0.0 - 4.0 ng/mL  03/15/2021 5.7   11/18/2021 4.6   05/23/2022 4.9  02/24/2023 5.9   03/06/2024 5.2    PMH: Past Medical History:  Diagnosis Date   Anemia    Anxiety    Bleeding disorder    Dysrhythmia/Atrial fib    Elevated PSA    History of atrial fibrillation    Hypertension    PVC (premature ventricular contraction)    Thrombocytopenia    Tubular adenoma     Surgical History: Past Surgical History:  Procedure Laterality Date   AORTA ANEURYSM REPAIR     CARDIAC VALVE REPLACEMENT     CARDIOVERSION N/A 02/22/2024   Procedure: CARDIOVERSION;  Surgeon: Wilburn Keller BROCKS, MD;  Location: [facility] ORS;  Service: Cardiovascular;  Laterality: N/A;   COLONOSCOPY WITH PROPOFOL  N/A 10/01/2018   Procedure: COLONOSCOPY WITH PROPOFOL ;  Surgeon: Viktoria Lamar DASEN, MD;  [facility] ENDOSCOPY;  Service: Endoscopy;  Laterality: N/A;   ESOPHAGEAL DILATION  11/23/2022   Procedure: ESOPHAGEAL DILATION;  Surgeon: Toledo, Teodoro K, MD;  Location: [facility] ENDOSCOPY;  Service: Gastroenterology;;   ESOPHAGOGASTRODUODENOSCOPY (EGD) WITH PROPOFOL  N/A 11/23/2022    Procedure: ESOPHAGOGASTRODUODENOSCOPY (EGD) WITH PROPOFOL ;  Surgeon: Toledo, Ladell POUR, MD;  Location: [facility] ENDOSCOPY;  Service: Gastroenterology;  Laterality: N/A;   TEE WITHOUT CARDIOVERSION N/A 02/22/2024   Procedure: ECHOCARDIOGRAM, TRANSESOPHAGEAL;  Surgeon: Alluri, Keller BROCKS, MD;  Location: [facility] ORS;  Service: Cardiovascular;  Laterality: N/A;   TONSILLECTOMY      Home Medications:  Allergies as of 03/08/2024       Reactions   Quinolones    Other reaction(s): Other (See Comments) Fluroquinolone antibiotics should be avoided in patients with history of aortic aneurysm/dissection         Medication List        Accurate as of March 08, 2024  9:39 AM. If you have any questions, ask your nurse or doctor.          STOP taking these medications    amoxicillin 500 MG capsule Commonly known as: AMOXIL Stopped by: Glendia BROCKS Barba       TAKE these medications    acetaminophen  650 MG CR tablet Commonly known as: TYLENOL  Take 650 mg by mouth every 8 (eight) hours as needed for pain.   amLODipine 10 MG tablet Commonly known as: NORVASC Take 10 mg by mouth at bedtime.   apixaban 5 MG Tabs tablet Commonly known as: ELIQUIS Take 5 mg by mouth 2 (two) times daily.   aspirin EC 81 MG tablet Take 81 mg by mouth daily. Swallow whole.   calcium citrate 950 (200 Ca) MG tablet Commonly  known as: CALCITRATE - dosed in mg elemental calcium Take 200 mg of elemental calcium by mouth daily.   calcium-vitamin D 500-200 MG-UNIT tablet Commonly known as: OSCAL WITH D Take 1 tablet by mouth daily at 12 noon.   diphenhydrAMINE 50 MG capsule Commonly known as: BENADRYL Take 50 mg by mouth daily at 12 noon.   Magnesium Oxide -Mg Supplement 500 MG Caps Take 1 capsule by mouth daily at 12 noon.   metoprolol  succinate 50 MG 24 hr tablet Commonly known as: TOPROL -XL Take 50 mg by mouth See admin instructions. 50mg  in the morning and 25mg  at night   Multi-Vitamins Tabs Take 1  tablet by mouth daily at 12 noon.   pantoprazole 40 MG tablet Commonly known as: PROTONIX Take 40 mg by mouth 2 (two) times daily.   POTASSIUM GLUCONATE PO Take 1 tablet by mouth daily at 12 noon.   tadalafil  20 MG tablet Commonly known as: CIALIS  Take 0.5 tablets (10 mg total) by mouth daily as needed for erectile dysfunction.   telmisartan 80 MG tablet Commonly known as: MICARDIS Take 40 mg by mouth daily.   torsemide 10 MG tablet Commonly known as: DEMADEX Take 5 mg by mouth daily at 12 noon.   VITAMIN D PO Take 1 tablet by mouth daily at 12 noon.        Allergies:  Allergies  Allergen Reactions   Quinolones     Other reaction(s): Other (See Comments) Fluroquinolone antibiotics should be avoided in patients with history of aortic aneurysm/dissection     Family History: Family History  Problem Relation Age of Onset   Bladder Cancer Neg Hx    Prostate cancer Neg Hx    Kidney cancer Neg Hx     Social History:  reports that he has never smoked. He has never used smokeless tobacco. He reports that he does not currently use alcohol. He reports that he does not use drugs.   Physical Exam: BP (!) 162/98   Pulse 67   Ht 6' 4 (1.93 m)   Wt 218 lb (98.9 kg)   BMI 26.54 kg/m   Constitutional:  Alert, No acute distress. HEENT: [place] AT Respiratory: Normal respiratory effort, no increased work of breathing. Psychiatric: Normal mood and affect.   Assessment & Plan:    1.  Elevated PSA Stable 1 year follow-up with PSA   Glendia JAYSON Barba, MD  [facility] [address] [place], [place] [address] [phone]

## 2024-05-01 ENCOUNTER — Ambulatory Visit: Admitting: Anesthesiology

## 2024-05-01 ENCOUNTER — Encounter: Payer: Self-pay | Admitting: Internal Medicine

## 2024-05-01 ENCOUNTER — Encounter: Admission: RE | Disposition: A | Payer: Self-pay | Source: Home / Self Care | Attending: Internal Medicine

## 2024-05-01 ENCOUNTER — Other Ambulatory Visit: Payer: Self-pay

## 2024-05-01 ENCOUNTER — Ambulatory Visit
Admission: RE | Admit: 2024-05-01 | Discharge: 2024-05-01 | Disposition: A | Discharge status: Home / Self Care | Attending: Internal Medicine | Admitting: Internal Medicine

## 2024-05-01 DIAGNOSIS — K641 Second degree hemorrhoids: Secondary | ICD-10-CM | POA: Diagnosis not present

## 2024-05-01 DIAGNOSIS — Z860101 Personal history of adenomatous and serrated colon polyps: Secondary | ICD-10-CM | POA: Insufficient documentation

## 2024-05-01 DIAGNOSIS — K5730 Diverticulosis of large intestine without perforation or abscess without bleeding: Secondary | ICD-10-CM | POA: Insufficient documentation

## 2024-05-01 DIAGNOSIS — Z1211 Encounter for screening for malignant neoplasm of colon: Secondary | ICD-10-CM | POA: Diagnosis present

## 2024-05-01 DIAGNOSIS — I10 Essential (primary) hypertension: Secondary | ICD-10-CM | POA: Diagnosis not present

## 2024-05-01 DIAGNOSIS — I4891 Unspecified atrial fibrillation: Secondary | ICD-10-CM | POA: Diagnosis not present

## 2024-05-01 HISTORY — PX: COLONOSCOPY: SHX5424

## 2024-05-01 MED ORDER — LIDOCAINE HCL (CARDIAC) PF 100 MG/5ML IV SOSY
PREFILLED_SYRINGE | INTRAVENOUS | Status: DC | PRN
  Administered 2024-05-01: 60 mg via INTRAVENOUS

## 2024-05-01 MED ORDER — PROPOFOL 500 MG/50ML IV EMUL
INTRAVENOUS | Status: DC | PRN
  Administered 2024-05-01: 75 ug/kg/min via INTRAVENOUS

## 2024-05-01 MED ORDER — PROPOFOL 1000 MG/100ML IV EMUL
INTRAVENOUS | Status: AC
  Filled 2024-05-01: qty 100

## 2024-05-01 MED ORDER — EPHEDRINE 5 MG/ML INJ
INTRAVENOUS | Status: AC
  Filled 2024-05-01: qty 5

## 2024-05-01 MED ORDER — DEXMEDETOMIDINE HCL IN NACL 80 MCG/20ML IV SOLN
INTRAVENOUS | Status: DC | PRN
  Administered 2024-05-01: 8 ug via INTRAVENOUS
  Administered 2024-05-01: 12 ug via INTRAVENOUS

## 2024-05-01 MED ORDER — SODIUM CHLORIDE 0.9 % IV SOLN: INTRAVENOUS | Status: DC

## 2024-05-01 MED ORDER — PROPOFOL 10 MG/ML IV BOLUS
INTRAVENOUS | Status: DC | PRN
  Administered 2024-05-01 (×3): 50 mg via INTRAVENOUS

## 2024-05-01 MED ORDER — LIDOCAINE HCL (PF) 2 % IJ SOLN
INTRAMUSCULAR | Status: AC
  Filled 2024-05-01: qty 5

## 2024-05-01 MED ORDER — DEXMEDETOMIDINE HCL IN NACL 80 MCG/20ML IV SOLN
INTRAVENOUS | Status: AC
  Filled 2024-05-01: qty 20

## 2024-05-01 NOTE — Anesthesia Postprocedure Evaluation (Signed)
"   Anesthesia Post Note  Patient: Dominic Osborn  Procedure(s) Performed: COLONOSCOPY  Patient location during evaluation: PACU Anesthesia Type: General Level of consciousness: awake and awake and alert Pain management: satisfactory to patient Vital Signs Assessment: post-procedure vital signs reviewed and stable Respiratory status: spontaneous breathing Cardiovascular status: stable Anesthetic complications: no   No notable events documented.   Last Vitals:  Vitals:   05/01/24 1025 05/01/24 1133  BP: 128/76 94/60  Pulse: 69 (!) 55  Resp: 20 15  Temp: (!) 35.6 C (!) 35.8 C  SpO2: 100% 97%    Last Pain:  Vitals:   05/01/24 1133  TempSrc: Temporal  PainSc: 0-No pain                 VAN STAVEREN,Burrell Hodapp      "

## 2024-05-01 NOTE — H&P (Signed)
 Outpatient short stay form Pre-procedure 05/01/2024 10:42 AM Dominic Osborn K. [name], M.D.  Primary Physician: [name], M.D.  Reason for visit:  History of adenomatous colon polyps  History of present illness:                            Patient presents for colonoscopy for a personal hx of colon polyps. The patient denies abdominal pain, abnormal weight loss or rectal bleeding.     Current Medications[1]  Medications Prior to Admission  Medication Sig Dispense Refill Last Dose/Taking   amLODipine (NORVASC) 10 MG tablet Take 10 mg by mouth at bedtime.   04/30/2024   aspirin EC 81 MG tablet Take 81 mg by mouth daily. Swallow whole.   04/30/2024   metoprolol  succinate (TOPROL -XL) 50 MG 24 hr tablet Take 50 mg by mouth See admin instructions. 50mg  in the morning and 25mg  at night   05/01/2024 Morning   pantoprazole (PROTONIX) 40 MG tablet Take 40 mg by mouth 2 (two) times daily.   05/01/2024 Morning   acetaminophen  (TYLENOL ) 650 MG CR tablet Take 650 mg by mouth every 8 (eight) hours as needed for pain.      apixaban (ELIQUIS) 5 MG TABS tablet Take 5 mg by mouth 2 (two) times daily.   04/26/2024   calcium citrate (CALCITRATE - DOSED IN MG ELEMENTAL CALCIUM) 950 (200 Ca) MG tablet Take 200 mg of elemental calcium by mouth daily.      calcium-vitamin D (OSCAL WITH D) 500-200 MG-UNIT tablet Take 1 tablet by mouth daily at 12 noon.      diphenhydrAMINE (BENADRYL) 50 MG capsule Take 50 mg by mouth daily at 12 noon.      Magnesium Oxide 500 MG CAPS Take 1 capsule by mouth daily at 12 noon.      Multiple Vitamin (MULTI-VITAMINS) TABS Take 1 tablet by mouth daily at 12 noon.      POTASSIUM GLUCONATE PO Take 1 tablet by mouth daily at 12 noon.      tadalafil  (CIALIS ) 20 MG tablet Take 0.5 tablets (10 mg total) by mouth daily as needed for erectile dysfunction. 90 tablet 0    telmisartan (MICARDIS) 80 MG tablet Take 40 mg by mouth daily.      torsemide (DEMADEX) 10 MG tablet Take 5 mg by mouth daily at 12  noon.      VITAMIN D PO Take 1 tablet by mouth daily at 12 noon.        Allergies[2]   Past Medical History:  Diagnosis Date   Anemia    Anxiety    Bleeding disorder    Dysrhythmia/Atrial fib    Elevated PSA    History of atrial fibrillation    Hypertension    PVC (premature ventricular contraction)    Thrombocytopenia    Tubular adenoma     Review of systems:  Otherwise negative.    Physical Exam  Gen: Alert, oriented. Appears stated age.  HEENT: [place]/AT. PERRLA. Lungs: CTA, no wheezes. CV: RR nl S1, S2. Abd: soft, benign, no masses. BS+ Ext: No edema. Pulses 2+    Planned procedures: Proceed with colonoscopy. The patient understands the nature of the planned procedure, indications, risks, alternatives and potential complications including but not limited to bleeding, infection, perforation, damage to internal organs and possible oversedation/side effects from anesthesia. The patient agrees and gives consent to proceed.  Please refer to procedure notes for findings, recommendations and patient disposition/instructions.  [name] K. [name], M.D. Gastroenterology 05/01/2024  10:42 AM          [1]  Current Facility-Administered Medications:    0.9 %  sodium chloride  infusion, , Intravenous, Continuous, Angelli Baruch K, MD, Last Rate: 20 mL/hr at 05/01/24 1028, New Bag at 05/01/24 1028 [2]  Allergies Allergen Reactions   Quinolones     Other reaction(s): Other (See Comments) Fluroquinolone antibiotics should be avoided in patients with history of aortic aneurysm/dissection

## 2024-05-01 NOTE — Transfer of Care (Signed)
 Immediate Anesthesia Transfer of Care Note  Patient: Dominic Osborn  Procedure(s) Performed: COLONOSCOPY  Patient Location: PACU  Anesthesia Type:General  Level of Consciousness: sedated  Airway & Oxygen Therapy: Patient Spontanous Breathing  Post-op Assessment: Report given to RN and Post -op Vital signs reviewed and stable  Post vital signs: Reviewed and stable  Last Vitals:  Vitals Value Taken Time  BP    Temp 35.8 C 05/01/24 11:33  Pulse 56 05/01/24 11:34  Resp 20 05/01/24 11:34  SpO2 98 % 05/01/24 11:34  Vitals shown include unfiled device data.  Last Pain:  Vitals:   05/01/24 1133  TempSrc: Temporal  PainSc:          Complications: No notable events documented.

## 2024-05-01 NOTE — Op Note (Signed)
 [facility] Gastroenterology Patient Name: Dominic Osborn Procedure Date: 05/01/2024 11:04 AM MRN: [id number] Account #: [id number] Date of Birth: [birth year] Admit Type: Outpatient Age: 79 Room: [facility] ENDO ROOM 2 Gender: Male Note Status: Finalized Instrument Name: Colon Scope [id number] Procedure:             Colonoscopy Indications:           High risk colon cancer surveillance: Personal history                         of non-advanced adenoma Providers:             Zamantha Strebel K. [name] MD, MD Referring MD:          Oneil PHEBE Pinal, MD (Referring MD) Medicines:             Propofol  per Anesthesia Complications:         No immediate complications. Estimated blood loss: None. Procedure:             Pre-Anesthesia Assessment:                        - The risks and benefits of the procedure and the                         sedation options and risks were discussed with the                         patient. All questions were answered and informed                         consent was obtained.                        - Patient identification and proposed procedure were                         verified prior to the procedure by the nurse. The                         procedure was verified in the procedure room.                        - ASA Grade Assessment: III - A patient with severe                         systemic disease.                        - After reviewing the risks and benefits, the patient                         was deemed in satisfactory condition to undergo the                         procedure.                        After obtaining informed consent, the colonoscope was  passed under direct vision. Throughout the procedure,                         the patient's blood pressure, pulse, and oxygen                         saturations were monitored continuously. The                         Colonoscope was introduced through the anus and                          advanced to the the cecum, identified by appendiceal                         orifice and ileocecal valve. The colonoscopy was                         performed without difficulty. The patient tolerated                         the procedure well. The quality of the bowel                         preparation was good. The ileocecal valve, appendiceal                         orifice, and rectum were photographed. Findings:      The perianal and digital rectal examinations were normal. Pertinent       negatives include normal sphincter tone and no palpable rectal lesions.      Non-bleeding internal hemorrhoids were found during retroflexion. The       hemorrhoids were Grade II (internal hemorrhoids that prolapse but reduce       spontaneously).      A few small-mouthed diverticula were found in the sigmoid colon.      The exam was otherwise without abnormality. Impression:            - Non-bleeding internal hemorrhoids.                        - Diverticulosis in the sigmoid colon.                        - The examination was otherwise normal.                        - No specimens collected. Recommendation:        - Patient has a contact number available for                         emergencies. The signs and symptoms of potential                         delayed complications were discussed with the patient.                         Return to normal activities tomorrow. Written  discharge instructions were provided to the patient.                        - Resume previous diet.                        - Continue present medications.                        - You do NOT require further colon cancer screening                         measures (Annual stool testing (i.e. hemoccult, FIT,                         cologuard), sigmoidoscopy, colonoscopy or CT                         colonography). You should share this recommendation                         with your  Primary Care provider.                        - Return to GI office PRN.                        - The findings and recommendations were discussed with                         the patient. Procedure Code(s):     --- Professional ---                        H9894, Colorectal cancer screening; colonoscopy on                         individual at high risk Diagnosis Code(s):     --- Professional ---                        K57.30, Diverticulosis of large intestine without                         perforation or abscess without bleeding                        K64.1, Second degree hemorrhoids                        Z86.010, Personal history of colonic polyps CPT copyright 2022 American [employer]. All rights reserved. The codes documented in this report are preliminary and upon coder review may  be revised to meet current compliance requirements. Ladell MARLA Boss MD, MD 05/01/2024 11:36:02 AM This report has been signed electronically. Number of Addenda: 0 Note Initiated On: 05/01/2024 11:04 AM Scope Withdrawal Time: 0 hours 6 minutes 37 seconds  Total Procedure Duration: 0 hours 10 minutes 59 seconds  Estimated Blood Loss:  Estimated blood loss: none.      [facility]

## 2024-05-01 NOTE — Anesthesia Preprocedure Evaluation (Signed)
 "                                  Anesthesia Evaluation  Patient identified by MRN, date of birth, ID band Patient awake    Reviewed: Allergy & Precautions, NPO status , Patient's Chart, lab work & pertinent test results  Airway Mallampati: II  TM Distance: >3 FB Neck ROM: full    Dental  (+) Teeth Intact   Pulmonary neg pulmonary ROS   Pulmonary exam normal        Cardiovascular Exercise Tolerance: Good hypertension, Pt. on medications negative cardio ROS Normal cardiovascular exam+ dysrhythmias Atrial Fibrillation  Rhythm:Regular Rate:Normal  S/p aortic valve replacement   Neuro/Psych   Anxiety     negative neurological ROS  negative psych ROS   GI/Hepatic negative GI ROS, Neg liver ROS,,,  Endo/Other  negative endocrine ROS    Renal/GU negative Renal ROS  negative genitourinary   Musculoskeletal   Abdominal   Peds negative pediatric ROS (+)  Hematology  (+) Blood dyscrasia, anemia   Anesthesia Other Findings Past Medical History: No date: Anemia No date: Anxiety No date: Bleeding disorder No date: Dysrhythmia/Atrial fib No date: Elevated PSA No date: History of atrial fibrillation No date: Hypertension No date: PVC (premature ventricular contraction) No date: Thrombocytopenia No date: Tubular adenoma  Past Surgical History: No date: AORTA ANEURYSM REPAIR No date: CARDIAC VALVE REPLACEMENT 02/22/2024: CARDIOVERSION; N/A     Comment:  Procedure: CARDIOVERSION;  Surgeon: Wilburn Keller BROCKS,               MD;  Location: [facility] ORS;  Service: Cardiovascular;                Laterality: N/A; 10/01/2018: COLONOSCOPY WITH PROPOFOL ; N/A     Comment:  Procedure: COLONOSCOPY WITH PROPOFOL ;  Surgeon: Viktoria Lamar DASEN, MD;  Location: [facility] ENDOSCOPY;  Service:               Endoscopy;  Laterality: N/A; 11/23/2022: ESOPHAGEAL DILATION     Comment:  Procedure: ESOPHAGEAL DILATION;  Surgeon: [name],               Teodoro K, MD;  Location:  [facility] ENDOSCOPY;  Service:               Gastroenterology;; 11/23/2022: ESOPHAGOGASTRODUODENOSCOPY (EGD) WITH PROPOFOL ; N/A     Comment:  Procedure: ESOPHAGOGASTRODUODENOSCOPY (EGD) WITH               PROPOFOL ;  Surgeon: Toledo, Ladell POUR, MD;  Location:               [facility] ENDOSCOPY;  Service: Gastroenterology;  Laterality:               N/A; 02/22/2024: TEE WITHOUT CARDIOVERSION; N/A     Comment:  Procedure: ECHOCARDIOGRAM, TRANSESOPHAGEAL;  Surgeon:               Wilburn Keller BROCKS, MD;  Location: [facility] ORS;  Service:               Cardiovascular;  Laterality: N/A; No date: TONSILLECTOMY  BMI    Body Mass Index: 26.85 kg/m      Reproductive/Obstetrics negative OB ROS  Anesthesia Physical Anesthesia Plan  ASA: 2  Anesthesia Plan: General   Post-op Pain Management:    Induction: Intravenous  PONV Risk Score and Plan: Propofol  infusion and TIVA  Airway Management Planned: Natural Airway and Nasal Cannula  Additional Equipment:   Intra-op Plan:   Post-operative Plan:   Informed Consent: I have reviewed the patients History and Physical, chart, labs and discussed the procedure including the risks, benefits and alternatives for the proposed anesthesia with the patient or authorized representative who has indicated his/her understanding and acceptance.     Dental Advisory Given  Plan Discussed with: CRNA  Anesthesia Plan Comments:         Anesthesia Quick Evaluation  "

## 2024-05-01 NOTE — Interval H&P Note (Signed)
 History and Physical Interval Note:  05/01/2024 10:43 AM  Dominic Osborn  has presented today for surgery, with the diagnosis of History of colon polyps (Z86.0100).  The various methods of treatment have been discussed with the patient and family. After consideration of risks, benefits and other options for treatment, the patient has consented to  Procedures: COLONOSCOPY (N/A) as a surgical intervention.  The patient's history has been reviewed, patient examined, no change in status, stable for surgery.  I have reviewed the patient's chart and labs.  Questions were answered to the patient's satisfaction.     [place], Dominic Osborn

## 2024-05-02 ENCOUNTER — Encounter: Payer: Self-pay | Admitting: Internal Medicine

## 2024-05-16 ENCOUNTER — Inpatient Hospital Stay
Admission: EM | Admit: 2024-05-16 | Disposition: A | Discharge status: Skilled Nursing Facility | Source: Home / Self Care | Attending: Pulmonary Disease | Admitting: Pulmonary Disease

## 2024-05-16 ENCOUNTER — Encounter
Admission: EM | Disposition: A | Discharge status: Skilled Nursing Facility | Payer: Self-pay | Source: Home / Self Care | Attending: Pulmonary Disease

## 2024-05-16 ENCOUNTER — Other Ambulatory Visit: Payer: Self-pay

## 2024-05-16 ENCOUNTER — Emergency Department

## 2024-05-16 ENCOUNTER — Emergency Department: Admitting: Certified Registered"

## 2024-05-16 DIAGNOSIS — R404 Transient alteration of awareness: Secondary | ICD-10-CM | POA: Diagnosis present

## 2024-05-16 DIAGNOSIS — G935 Compression of brain: Secondary | ICD-10-CM | POA: Diagnosis present

## 2024-05-16 DIAGNOSIS — S065XAA Traumatic subdural hemorrhage with loss of consciousness status unknown, initial encounter: Principal | ICD-10-CM | POA: Diagnosis present

## 2024-05-16 DIAGNOSIS — R739 Hyperglycemia, unspecified: Secondary | ICD-10-CM

## 2024-05-16 DIAGNOSIS — I10 Essential (primary) hypertension: Secondary | ICD-10-CM | POA: Diagnosis not present

## 2024-05-16 DIAGNOSIS — I4891 Unspecified atrial fibrillation: Secondary | ICD-10-CM

## 2024-05-16 DIAGNOSIS — N179 Acute kidney failure, unspecified: Secondary | ICD-10-CM

## 2024-05-16 DIAGNOSIS — Z952 Presence of prosthetic heart valve: Secondary | ICD-10-CM

## 2024-05-16 DIAGNOSIS — S0083XA Contusion of other part of head, initial encounter: Secondary | ICD-10-CM

## 2024-05-16 DIAGNOSIS — Z8679 Personal history of other diseases of the circulatory system: Secondary | ICD-10-CM

## 2024-05-16 DIAGNOSIS — S064XAA Epidural hemorrhage with loss of consciousness status unknown, initial encounter: Secondary | ICD-10-CM

## 2024-05-16 LAB — CBC
HCT: 38.6 % — ABNORMAL LOW (ref 39.0–52.0)
Hemoglobin: 12.9 g/dL — ABNORMAL LOW (ref 13.0–17.0)
MCH: 29.7 pg (ref 26.0–34.0)
MCHC: 33.4 g/dL (ref 30.0–36.0)
MCV: 88.7 fL (ref 80.0–100.0)
Platelets: 156 10*3/uL (ref 150–400)
RBC: 4.35 MIL/uL (ref 4.22–5.81)
RDW: 13.1 % (ref 11.5–15.5)
WBC: 10.7 10*3/uL — ABNORMAL HIGH (ref 4.0–10.5)
nRBC: 0 % (ref 0.0–0.2)

## 2024-05-16 LAB — BASIC METABOLIC PANEL WITH GFR
Anion gap: 13 (ref 5–15)
BUN: 35 mg/dL — ABNORMAL HIGH (ref 8–23)
CO2: 24 mmol/L (ref 22–32)
Calcium: 9.4 mg/dL (ref 8.9–10.3)
Chloride: 106 mmol/L (ref 98–111)
Creatinine, Ser: 1.53 mg/dL — ABNORMAL HIGH (ref 0.61–1.24)
GFR, Estimated: 47 mL/min — ABNORMAL LOW
Glucose, Bld: 133 mg/dL — ABNORMAL HIGH (ref 70–99)
Potassium: 3.9 mmol/L (ref 3.5–5.1)
Sodium: 143 mmol/L (ref 135–145)

## 2024-05-16 LAB — PROTIME-INR
INR: 1.3 — ABNORMAL HIGH (ref 0.8–1.2)
Prothrombin Time: 16.9 s — ABNORMAL HIGH (ref 11.4–15.2)

## 2024-05-16 LAB — APTT: aPTT: 33 s (ref 24–36)

## 2024-05-16 MED ORDER — MANNITOL 20 % IV SOLN
12.5000 g | Freq: Once | INTRAVENOUS | Status: AC
  Administered 2024-05-16: 12.5 g via INTRAVENOUS
  Filled 2024-05-16: qty 250

## 2024-05-16 MED ORDER — SUCCINYLCHOLINE CHLORIDE 200 MG/10ML IV SOSY
PREFILLED_SYRINGE | INTRAVENOUS | Status: DC | PRN
  Administered 2024-05-16: 100 mg via INTRAVENOUS

## 2024-05-16 MED ORDER — HYDROMORPHONE HCL 1 MG/ML IJ SOLN
INTRAMUSCULAR | Status: DC | PRN
  Administered 2024-05-16 (×2): .5 mg via INTRAVENOUS

## 2024-05-16 MED ORDER — PHENYLEPHRINE HCL-NACL 20-0.9 MG/250ML-% IV SOLN
INTRAVENOUS | Status: DC | PRN
  Administered 2024-05-16: 20 ug/min via INTRAVENOUS

## 2024-05-16 MED ORDER — SUGAMMADEX SODIUM 200 MG/2ML IV SOLN
INTRAVENOUS | Status: DC | PRN
  Administered 2024-05-16: 200 mg via INTRAVENOUS

## 2024-05-16 MED ORDER — BACITRACIN ZINC 500 UNIT/GM EX OINT
TOPICAL_OINTMENT | CUTANEOUS | Status: AC
  Filled 2024-05-16: qty 28.35

## 2024-05-16 MED ORDER — 0.9 % SODIUM CHLORIDE (POUR BTL) OPTIME
TOPICAL | Status: DC | PRN
  Administered 2024-05-16: 500 mL

## 2024-05-16 MED ORDER — CEFAZOLIN SODIUM-DEXTROSE 2-3 GM-%(50ML) IV SOLR
INTRAVENOUS | Status: DC | PRN
  Administered 2024-05-16: 2 g via INTRAVENOUS

## 2024-05-16 MED ORDER — SEVOFLURANE IN SOLN
RESPIRATORY_TRACT | Status: AC
  Filled 2024-05-16: qty 250

## 2024-05-16 MED ORDER — SURGIFLO WITH THROMBIN (HEMOSTATIC MATRIX KIT) OPTIME
TOPICAL | Status: DC | PRN
  Administered 2024-05-16: 1 via TOPICAL

## 2024-05-16 MED ORDER — PROPOFOL 10 MG/ML IV BOLUS
INTRAVENOUS | Status: DC | PRN
  Administered 2024-05-16: 50 mg via INTRAVENOUS
  Administered 2024-05-16: 150 mg via INTRAVENOUS
  Administered 2024-05-16: 50 mg via INTRAVENOUS

## 2024-05-16 MED ORDER — PHENYLEPHRINE 80 MCG/ML (10ML) SYRINGE FOR IV PUSH (FOR BLOOD PRESSURE SUPPORT)
PREFILLED_SYRINGE | INTRAVENOUS | Status: DC | PRN
  Administered 2024-05-16 (×5): 120 ug via INTRAVENOUS
  Administered 2024-05-16: 160 ug via INTRAVENOUS

## 2024-05-16 MED ORDER — FENTANYL CITRATE (PF) 100 MCG/2ML IJ SOLN: 25.0000 ug | INTRAMUSCULAR | Status: DC | PRN

## 2024-05-16 MED ORDER — DEXAMETHASONE SOD PHOSPHATE PF 10 MG/ML IJ SOLN
INTRAMUSCULAR | Status: DC | PRN
  Administered 2024-05-16: 10 mg via INTRAVENOUS

## 2024-05-16 MED ORDER — TETANUS-DIPHTH-ACELL PERTUSSIS 5-2-15.5 LF-MCG/0.5 IM SUSP
0.5000 mL | Freq: Once | INTRAMUSCULAR | Status: AC
  Administered 2024-05-16: 0.5 mL via INTRAMUSCULAR
  Filled 2024-05-16: qty 0.5

## 2024-05-16 MED ORDER — OXYCODONE HCL 5 MG PO TABS: 5.0000 mg | ORAL_TABLET | Freq: Once | ORAL | Status: DC | PRN

## 2024-05-16 MED ORDER — LIDOCAINE HCL (CARDIAC) PF 100 MG/5ML IV SOSY
PREFILLED_SYRINGE | INTRAVENOUS | Status: DC | PRN
  Administered 2024-05-16: 100 mg via INTRAVENOUS

## 2024-05-16 MED ORDER — LIDOCAINE-EPINEPHRINE 1 %-1:100000 IJ SOLN
INTRAMUSCULAR | Status: AC
  Filled 2024-05-16: qty 20

## 2024-05-16 MED ORDER — ONDANSETRON HCL 4 MG/2ML IJ SOLN
INTRAMUSCULAR | Status: DC | PRN
  Administered 2024-05-16: 4 mg via INTRAVENOUS

## 2024-05-16 MED ORDER — FENTANYL CITRATE (PF) 100 MCG/2ML IJ SOLN
INTRAMUSCULAR | Status: AC
  Filled 2024-05-16: qty 2

## 2024-05-16 MED ORDER — PROTHROMBIN COMPLEX CONC HUMAN 1000 UNITS IV KIT
4967.0000 [IU] | PACK | Status: AC
  Administered 2024-05-16: 4967 [IU] via INTRAVENOUS
  Filled 2024-05-16: qty 4467

## 2024-05-16 MED ORDER — ONDANSETRON HCL 4 MG/2ML IJ SOLN
4.0000 mg | Freq: Once | INTRAMUSCULAR | Status: AC
  Administered 2024-05-16: 4 mg via INTRAVENOUS
  Filled 2024-05-16: qty 2

## 2024-05-16 MED ORDER — ROCURONIUM BROMIDE 100 MG/10ML IV SOLN
INTRAVENOUS | Status: DC | PRN
  Administered 2024-05-16: 60 mg via INTRAVENOUS
  Administered 2024-05-16: 20 mg via INTRAVENOUS

## 2024-05-16 MED ORDER — LIDOCAINE-EPINEPHRINE 1 %-1:100000 IJ SOLN
INTRAMUSCULAR | Status: DC | PRN
  Administered 2024-05-16: 5 mL

## 2024-05-16 MED ORDER — HYDROMORPHONE HCL 1 MG/ML IJ SOLN
INTRAMUSCULAR | Status: AC
  Filled 2024-05-16: qty 1

## 2024-05-16 MED ORDER — EPHEDRINE SULFATE-NACL 50-0.9 MG/10ML-% IV SOSY
PREFILLED_SYRINGE | INTRAVENOUS | Status: DC | PRN
  Administered 2024-05-16: 10 mg via INTRAVENOUS

## 2024-05-16 MED ORDER — LACTATED RINGERS IV SOLN: INTRAVENOUS | Status: DC | PRN

## 2024-05-16 MED ORDER — SODIUM CHLORIDE 0.9 % IV SOLN
INTRAVENOUS | Status: DC | PRN
  Administered 2024-05-16: 1000 mg via INTRAVENOUS

## 2024-05-16 MED ORDER — MANNITOL 20 % IV SOLN: INTRAVENOUS | Status: DC | PRN

## 2024-05-16 MED ORDER — FENTANYL CITRATE (PF) 100 MCG/2ML IJ SOLN
INTRAMUSCULAR | Status: DC | PRN
  Administered 2024-05-16 (×2): 50 ug via INTRAVENOUS

## 2024-05-16 MED ORDER — ACETAMINOPHEN 10 MG/ML IV SOLN
1000.0000 mg | Freq: Once | INTRAVENOUS | Status: AC
  Administered 2024-05-16: 1000 mg via INTRAVENOUS
  Filled 2024-05-16: qty 100

## 2024-05-16 MED ORDER — DEXMEDETOMIDINE HCL IN NACL 80 MCG/20ML IV SOLN
INTRAVENOUS | Status: DC | PRN
  Administered 2024-05-16: 8 ug via INTRAVENOUS

## 2024-05-16 MED ORDER — BACITRACIN 500 UNIT/GM EX OINT
TOPICAL_OINTMENT | CUTANEOUS | Status: DC | PRN
  Administered 2024-05-16: 1 via TOPICAL

## 2024-05-16 MED ORDER — BUPIVACAINE HCL (PF) 0.5 % IJ SOLN
INTRAMUSCULAR | Status: AC
  Filled 2024-05-16: qty 30

## 2024-05-16 MED ORDER — OXYCODONE HCL 5 MG/5ML PO SOLN: 5.0000 mg | Freq: Once | ORAL | Status: DC | PRN

## 2024-05-16 MED ORDER — BUPIVACAINE HCL (PF) 0.5 % IJ SOLN
INTRAMUSCULAR | Status: DC | PRN
  Administered 2024-05-16: 5 mL

## 2024-05-16 NOTE — Anesthesia Preprocedure Evaluation (Signed)
 "                                  Anesthesia Evaluation  Patient identified by MRN, date of birth, ID band Patient awake    Reviewed: Allergy & Precautions, NPO status , Patient's Chart, lab work & pertinent test results  History of Anesthesia Complications Negative for: history of anesthetic complications  Airway Mallampati: II  TM Distance: >3 FB Neck ROM: full    Dental no notable dental hx.    Pulmonary neg pulmonary ROS   Pulmonary exam normal        Cardiovascular hypertension, On Medications + dysrhythmias Atrial Fibrillation + Valvular Problems/Murmurs (bicuspid aortic valve s/p replacement)  Rhythm:Regular Rate:Normal  Echo and EKG reviewed    Neuro/Psych  PSYCHIATRIC DISORDERS Anxiety     negative neurological ROS     GI/Hepatic negative GI ROS, Neg liver ROS,,,  Endo/Other  negative endocrine ROS    Renal/GU ARFRenal disease     Musculoskeletal   Abdominal   Peds  Hematology  (+) Blood dyscrasia, anemia   Anesthesia Other Findings Past Medical History: No date: Anemia No date: Anxiety No date: Bleeding disorder No date: Dysrhythmia/Atrial fib No date: Elevated PSA No date: History of atrial fibrillation No date: Hypertension No date: PVC (premature ventricular contraction) No date: Thrombocytopenia No date: Tubular adenoma  Past Surgical History: No date: AORTA ANEURYSM REPAIR No date: CARDIAC VALVE REPLACEMENT 02/22/2024: CARDIOVERSION; N/A     Comment:  Procedure: CARDIOVERSION;  Surgeon: Wilburn Keller BROCKS,               MD;  Location: [facility] ORS;  Service: Cardiovascular;                Laterality: N/A; 05/01/2024: COLONOSCOPY; N/A     Comment:  Procedure: COLONOSCOPY;  Surgeon: Toledo, Ladell POUR, MD;              Location: [facility] ENDOSCOPY;  Service: Gastroenterology;                Laterality: N/A; 10/01/2018: COLONOSCOPY WITH PROPOFOL ; N/A     Comment:  Procedure: COLONOSCOPY WITH PROPOFOL ;  Surgeon: Viktoria Lamar DASEN, MD;  Location: [facility] ENDOSCOPY;  Service:               Endoscopy;  Laterality: N/A; 11/23/2022: ESOPHAGEAL DILATION     Comment:  Procedure: ESOPHAGEAL DILATION;  Surgeon: [name],               Teodoro K, MD;  Location: [facility] ENDOSCOPY;  Service:               Gastroenterology;; 11/23/2022: ESOPHAGOGASTRODUODENOSCOPY (EGD) WITH PROPOFOL ; N/A     Comment:  Procedure: ESOPHAGOGASTRODUODENOSCOPY (EGD) WITH               PROPOFOL ;  Surgeon: Toledo, Ladell POUR, MD;  Location:               [facility] ENDOSCOPY;  Service: Gastroenterology;  Laterality:               N/A; 02/22/2024: TEE WITHOUT CARDIOVERSION; N/A     Comment:  Procedure: ECHOCARDIOGRAM, TRANSESOPHAGEAL;  Surgeon:               Alluri, Keller BROCKS, MD;  Location: [facility] ORS;  Service:  Cardiovascular;  Laterality: N/A; No date: TONSILLECTOMY  BMI    Body Mass Index: 26.78 kg/m      Reproductive/Obstetrics negative OB ROS                              Anesthesia Physical Anesthesia Plan  ASA: 3  Anesthesia Plan: General ETT   Post-op Pain Management: Ofirmev  IV (intra-op)*   Induction: Intravenous and Rapid sequence  PONV Risk Score and Plan: 2 and Ondansetron , Dexamethasone  and Treatment may vary due to age or medical condition  Airway Management Planned: Oral ETT  Additional Equipment:   Intra-op Plan:   Post-operative Plan: Extubation in OR  Informed Consent: I have reviewed the patients History and Physical, chart, labs and discussed the procedure including the risks, benefits and alternatives for the proposed anesthesia with the patient or authorized representative who has indicated his/her understanding and acceptance.     Dental Advisory Given  Plan Discussed with: Anesthesiologist, CRNA and Surgeon  Anesthesia Plan Comments: (Patient consented for risks of anesthesia including but not limited to:  - adverse reactions to medications - damage to eyes, teeth, lips or  other oral mucosa - nerve damage due to positioning  - sore throat or hoarseness - Damage to heart, brain, nerves, lungs, other parts of body or loss of life  Patient voiced understanding and assent.)         Anesthesia Quick Evaluation  "

## 2024-05-16 NOTE — H&P (View-Only) (Signed)
 "   Consult requested by:  Dr. Fernand  Consult requested for:  Subdural hematoma  Primary Physician:  Cleotilde Oneil FALCON, MD  History of Present Illness: 05/16/2024 [name] is here with a chief complaint of fall.  He had a mechanical fall in the eyes.  There was no loss of consciousness.  He presented to the emergency department where he was found to have a large subdural hematoma.  He began declining and required administration of mannitol .  He complains of headache currently.  Otherwise, he has no complaints currently.  Of note, he is on Eliquis for atrial fibrillation.  I have utilized the care everywhere function in epic to review the outside records available from external health systems.  Review of Systems:  A 10 point review of systems is negative, except for the pertinent positives and negatives detailed in the HPI.  Past Medical History: Past Medical History:  Diagnosis Date   Anemia    Anxiety    Bleeding disorder    Dysrhythmia/Atrial fib    Elevated PSA    History of atrial fibrillation    Hypertension    PVC (premature ventricular contraction)    Thrombocytopenia    Tubular adenoma     Past Surgical History: Past Surgical History:  Procedure Laterality Date   AORTA ANEURYSM REPAIR     CARDIAC VALVE REPLACEMENT     CARDIOVERSION N/A 02/22/2024   Procedure: CARDIOVERSION;  Surgeon: Wilburn Dominic BROCKS, MD;  Location: [facility] ORS;  Service: Cardiovascular;  Laterality: N/A;   COLONOSCOPY N/A 05/01/2024   Procedure: COLONOSCOPY;  Surgeon: Osborn, Dominic POUR, MD;  Location: [facility] ENDOSCOPY;  Service: Gastroenterology;  Laterality: N/A;   COLONOSCOPY WITH PROPOFOL  N/A 10/01/2018   Procedure: COLONOSCOPY WITH PROPOFOL ;  Surgeon: Dominic Lamar DASEN, MD;  Location: [facility] ENDOSCOPY;  Service: Endoscopy;  Laterality: N/A;   ESOPHAGEAL DILATION  11/23/2022   Procedure: ESOPHAGEAL DILATION;  Surgeon: Osborn, Dominic K, MD;  Location: [facility] ENDOSCOPY;  Service: Gastroenterology;;    ESOPHAGOGASTRODUODENOSCOPY (EGD) WITH PROPOFOL  N/A 11/23/2022   Procedure: ESOPHAGOGASTRODUODENOSCOPY (EGD) WITH PROPOFOL ;  Surgeon: Osborn, Dominic POUR, MD;  Location: [facility] ENDOSCOPY;  Service: Gastroenterology;  Laterality: N/A;   TEE WITHOUT CARDIOVERSION N/A 02/22/2024   Procedure: ECHOCARDIOGRAM, TRANSESOPHAGEAL;  Surgeon: Dominic Osborn, Dominic BROCKS, MD;  Location: [facility] ORS;  Service: Cardiovascular;  Laterality: N/A;   TONSILLECTOMY      Allergies: Allergies as of 05/16/2024 - Review Complete 05/16/2024  Allergen Reaction Noted   Quinolones  01/15/2021    Medications: Active Medications[1]  Social History: Social History[2]  Family Medical History: Family History  Problem Relation Age of Onset   Bladder Cancer Neg Hx    Prostate cancer Neg Hx    Kidney cancer Neg Hx     Physical Examination: Vitals:   05/16/24 1855 05/16/24 1900  BP:  (!) 150/80  Pulse: 66 69  Resp: 20 (!) 26  Temp:    SpO2: 100% 100%    General: Patient is in no apparent distress. Attention to examination is appropriate.  Neck:   Supple.  Full range of motion.  Respiratory: Patient is breathing without any difficulty.  He has bruising around his left eye.  He has a small abrasion above his left eyebrow.   NEUROLOGICAL:     OE voice. Regards, follows commands.  Awake, alert, oriented to person, place, and time.  Speech is slow but clear and fluent.  Cranial Nerves: Pupils equal round and reactive to light.  Facial tone is symmetric.  Facial sensation is symmetric. Shoulder shrug is symmetric. Tongue protrusion is midline.  There is no pronator drift.  Strength: Side Biceps Triceps Deltoid Interossei Grip Wrist Ext. Wrist Flex.  R 5 5 5 5 5 5 5   L 5 5 5 5 5 5 5    Side Iliopsoas Quads Hamstring PF DF EHL  R 5 5 5 5 5 5   L 5 5 5 5 5 5    Bilateral upper and lower extremity sensation is intact to light touch.    No evidence of dysmetria noted.  Gait is untested.     Medical Decision  Making  Imaging: CT Head 05/16/2024  IMPRESSION: 1. Acute right cerebral convexity subdural hematoma measuring up to 1.8 cm with associated mass effect, effacement of the right lateral ventricle, and right to left midline shift measuring 1.2 cm. The findings of this exam were called to Dr. Dorothyann at the time of exam interpretation.   Electronically signed by: Oneil Devonshire MD 05/16/2024 05:48 PM EST RP Workstation: [id number]   I have personally reviewed the images and agree with the above interpretation.  Assessment and Plan: [name] is a pleasant 79 y.o. male with large right subdural hematoma.  He had a decline in mental status but has improved after administration of mannitol .  He has 1.2 cm of right-to-left midline shift due to brain compression from the subdural hematoma.  He has a current Glasgow Coma Scale of 14 but had worsened to 13 prior to mannitol .  Given the size of his subdural hematoma and his worsening mental status prior to administration of mannitol , I feel that he requires urgent craniotomy for evacuation of subdural hematoma.  I we will work with the operating room to facilitate this.  He has already received Kcentra  to reverse his anticoagulation status.  I discussed the planned procedure at length with the patient and his family, including the risks, benefits, alternatives, and indications. The risks discussed include but are not limited to bleeding, infection, need for reoperation, spinal fluid leak, stroke, vision loss, anesthetic complication, coma, paralysis, and even death. I also described in detail that improvement was not guaranteed.  The patient and his family expressed understanding of these risks, and asked that we proceed with surgery. I described the surgery in layman's terms, and gave ample opportunity for questions, which were answered to the best of my ability.  We will proceed under emergency basis.  He will be admitted to the intensive care unit  postoperatively for close neurological monitoring.     I have communicated my recommendations to the requesting physician and coordinated care to facilitate these recommendations.     Irelynd Zumstein K. Clois MD, [facility]     [1]  No outpatient medications have been marked as taking for the 05/16/24 encounter [facility] Encounter).  [2]  Social History Tobacco Use   Smoking status: Never   Smokeless tobacco: Never  Vaping Use   Vaping status: Never Used  Substance Use Topics   Alcohol use: Not Currently    Comment: occasional   Drug use: No   "

## 2024-05-16 NOTE — Transfer of Care (Signed)
 Immediate Anesthesia Transfer of Care Note  Patient: Dominic Osborn  Procedure(s) Performed: CRANIOTOMY HEMATOMA EVACUATION SUBDURAL (Right)  Patient Location: PACU  Anesthesia Type:General  Level of Consciousness: drowsy  Airway & Oxygen Therapy: Patient Spontanous Breathing and Patient connected to face mask oxygen  Post-op Assessment: Report given to RN, Post -op Vital signs reviewed and stable, and Patient moving all extremities  Post vital signs: Reviewed and stable  Last Vitals:  Vitals Value Taken Time  BP 111/62 05/16/24 23:22  Temp    Pulse 66 05/16/24 23:27  Resp 17 05/16/24 23:27  SpO2 99 % 05/16/24 23:27  Vitals shown include unfiled device data.  Last Pain:  Vitals:   05/16/24 1859  PainSc: 1          Complications: No notable events documented.

## 2024-05-16 NOTE — ED Notes (Addendum)
 First Nurse Note: Pt to ED via [facility]. Pt here for fall. Pt hit his head. Pt has headache. Pt is on thinners, pt did not have LOC. Pt also c/o dizziness.

## 2024-05-16 NOTE — Interval H&P Note (Signed)
 History and Physical Interval Note:  05/16/2024 7:45 PM  Dominic Osborn  has presented today for surgery, with the diagnosis of subdural hematoma.  The various methods of treatment have been discussed with the patient and family. After consideration of risks, benefits and other options for treatment, the patient has consented to  Procedures: CRANIOTOMY HEMATOMA EVACUATION SUBDURAL (Right) as a surgical intervention.  The patient's history has been reviewed, patient examined, no change in status, stable for surgery.  I have reviewed the patient's chart and labs.  Questions were answered to the patient's satisfaction.    Heart sounds normal no MRG. Chest Clear to Auscultation Bilaterally.   Dominic Osborn

## 2024-05-16 NOTE — ED Notes (Signed)
 Pt assisted by this RN and Rosina PEAK to use urinal at bedside

## 2024-05-16 NOTE — Consult Note (Signed)
 MEDICATION RELATED CONSULT NOTE - INITIAL   Pharmacy Consult for monitoring post Kcentra  Indication: Large subdural hematoma on Eliquis  Allergies[1]  Patient Measurements: Height: 6' 4 (193 cm) Weight: 99.8 kg (220 lb) IBW/kg (Calculated) : 86.8   Vital Signs: Temp: 98 F (36.7 C) (01/29 1625) BP: 143/89 (01/29 1625) Pulse Rate: 70 (01/29 1625)   Medical History: Past Medical History:  Diagnosis Date   Anemia    Anxiety    Bleeding disorder    Dysrhythmia/Atrial fib    Elevated PSA    History of atrial fibrillation    Hypertension    PVC (premature ventricular contraction)    Thrombocytopenia    Tubular adenoma     Medications:  Eliquis 5 mg po BID, last reported dose morning of 05/16/24  Assessment: 78yo male presenting to ED after fall.  Imaging revealed large subdural hematoma.   Plan:  Kcentra  4967 units (49.77 units/kg) IV x 1 ordered No additional lab monitoring required in DOAC reversal per pharmacy policy  Kayla JULIANNA Blew, PharmD 05/16/2024,6:24 PM       [1]  Allergies Allergen Reactions   Quinolones     Other reaction(s): Other (See Comments) Fluroquinolone antibiotics should be avoided in patients with history of aortic aneurysm/dissection

## 2024-05-16 NOTE — ED Triage Notes (Addendum)
 Pt comes with slip and fall on ice. Pt states he went to check his heat pump around hi house and fell. Pt hit his head. No loc and is on eliquis.   Pt has hematoma noted to forehead above left eye. Pt injured left wrist and swelling noted. Pt states slight headache.

## 2024-05-16 NOTE — Op Note (Signed)
 Indications: The patient is a 79yo male who presented with a subdural hematoma.  Due to ongoing brain compression and symptoms, surgical intervention was recommended.   Findings: subdural hematoma  Preoperative Diagnosis: subdural hematoma Postoperative Diagnosis: same   EBL: 250 ml IVF: see AR Drains: one Disposition: Extubated and Stable to PACU Complications: none  A foley catheter was placed.   Preoperative Note:   Risks of surgery discussed include: infection, bleeding, stroke, coma, death, paralysis, CSF leak, nerve/spinal cord injury, numbness, tingling, weakness, vascular injury, need for further surgery, persistent symptoms, and the risks of anesthesia. The patient understood these risks and agreed to proceed.  NAME OF PROCEDURE:               1. Right Craniotomy for evacuation of hematoma   PROCEDURE:  Patient was brought to the operating room, intubated. The mayfield pins were applied.  The patient was then positioned for a right-sided frontoparietotemporal craniotomy.    The incision was planned, then prepped and draped in standard fashion.  The incision was opened sharply, then the galea opened.  A retractor was placed.  The temporalis muscle was divided, then the periosteal used to reflect the muscle.   A frontotemporoparietal craniotomy was then fashioned with the burr and craniotome.  The dura was identified, then opened sharply.  A subdural hematoma was identified.  The acute subdural was then removed using irrigation and suction.  After removal, the intradural space was inspected and hemostasis achieved.    After hemostasis was achieved, we turned attention to closure.  The dura was approximated.  Tackup sutures were placed. The craniotomy site was checked and a fixation plate used to reconstruct the skull.  A drain was placed. The temporalis and galea were closed.  Staples were used on the skin. A sterile dressing was placed.    Needle and sponge counts were  correct at the end of the case.    I performed the procedure.  Reeves Daisy MD Neurosurgery

## 2024-05-16 NOTE — Consult Note (Addendum)
 "   Consult requested by:  Dr. Fernand  Consult requested for:  Subdural hematoma  Primary Physician:  Cleotilde Dominic FALCON, MD  History of Present Illness: 05/16/2024 [name] is here with a chief complaint of fall.  He had a mechanical fall in the eyes.  There was no loss of consciousness.  He presented to the emergency department where he was found to have a large subdural hematoma.  He began declining and required administration of mannitol .  He complains of headache currently.  Otherwise, he has no complaints currently.  Of note, he is on Eliquis for atrial fibrillation.  I have utilized the care everywhere function in epic to review the outside records available from external health systems.  Review of Systems:  A 10 point review of systems is negative, except for the pertinent positives and negatives detailed in the HPI.  Past Medical History: Past Medical History:  Diagnosis Date   Anemia    Anxiety    Bleeding disorder    Dysrhythmia/Atrial fib    Elevated PSA    History of atrial fibrillation    Hypertension    PVC (premature ventricular contraction)    Thrombocytopenia    Tubular adenoma     Past Surgical History: Past Surgical History:  Procedure Laterality Date   AORTA ANEURYSM REPAIR     CARDIAC VALVE REPLACEMENT     CARDIOVERSION N/A 02/22/2024   Procedure: CARDIOVERSION;  Surgeon: Wilburn Keller BROCKS, MD;  Location: [facility] ORS;  Service: Cardiovascular;  Laterality: N/A;   COLONOSCOPY N/A 05/01/2024   Procedure: COLONOSCOPY;  Surgeon: Toledo, Ladell POUR, MD;  Location: [facility] ENDOSCOPY;  Service: Gastroenterology;  Laterality: N/A;   COLONOSCOPY WITH PROPOFOL  N/A 10/01/2018   Procedure: COLONOSCOPY WITH PROPOFOL ;  Surgeon: Viktoria Lamar DASEN, MD;  Location: [facility] ENDOSCOPY;  Service: Endoscopy;  Laterality: N/A;   ESOPHAGEAL DILATION  11/23/2022   Procedure: ESOPHAGEAL DILATION;  Surgeon: Toledo, Teodoro K, MD;  Location: [facility] ENDOSCOPY;  Service: Gastroenterology;;    ESOPHAGOGASTRODUODENOSCOPY (EGD) WITH PROPOFOL  N/A 11/23/2022   Procedure: ESOPHAGOGASTRODUODENOSCOPY (EGD) WITH PROPOFOL ;  Surgeon: Toledo, Ladell POUR, MD;  Location: [facility] ENDOSCOPY;  Service: Gastroenterology;  Laterality: N/A;   TEE WITHOUT CARDIOVERSION N/A 02/22/2024   Procedure: ECHOCARDIOGRAM, TRANSESOPHAGEAL;  Surgeon: Alluri, Keller BROCKS, MD;  Location: [facility] ORS;  Service: Cardiovascular;  Laterality: N/A;   TONSILLECTOMY      Allergies: Allergies as of 05/16/2024 - Review Complete 05/16/2024  Allergen Reaction Noted   Quinolones  01/15/2021    Medications: Active Medications[1]  Social History: Social History[2]  Family Medical History: Family History  Problem Relation Age of Onset   Bladder Cancer Neg Hx    Prostate cancer Neg Hx    Kidney cancer Neg Hx     Physical Examination: Vitals:   05/16/24 1855 05/16/24 1900  BP:  (!) 150/80  Pulse: 66 69  Resp: 20 (!) 26  Temp:    SpO2: 100% 100%    General: Patient is in no apparent distress. Attention to examination is appropriate.  Neck:   Supple.  Full range of motion.  Respiratory: Patient is breathing without any difficulty.  He has bruising around his left eye.  He has a small abrasion above his left eyebrow.   NEUROLOGICAL:     OE voice. Regards, follows commands.  Awake, alert, oriented to person, place, and time.  Speech is slow but clear and fluent.  Cranial Nerves: Pupils equal round and reactive to light.  Facial tone is symmetric.  Facial sensation is symmetric. Shoulder shrug is symmetric. Tongue protrusion is midline.  There is no pronator drift.  Strength: Side Biceps Triceps Deltoid Interossei Grip Wrist Ext. Wrist Flex.  R 5 5 5 5 5 5 5   L 5 5 5 5 5 5 5    Side Iliopsoas Quads Hamstring PF DF EHL  R 5 5 5 5 5 5   L 5 5 5 5 5 5    Bilateral upper and lower extremity sensation is intact to light touch.    No evidence of dysmetria noted.  Gait is untested.     Medical Decision  Making  Imaging: CT Head 05/16/2024  IMPRESSION: 1. Acute right cerebral convexity subdural hematoma measuring up to 1.8 cm with associated mass effect, effacement of the right lateral ventricle, and right to left midline shift measuring 1.2 cm. The findings of this exam were called to Dr. Dorothyann at the time of exam interpretation.   Electronically signed by: Dominic Devonshire MD 05/16/2024 05:48 PM EST RP Workstation: [id number]   I have personally reviewed the images and agree with the above interpretation.  Assessment and Plan: [name] is a pleasant 79 y.o. male with large right subdural hematoma.  He had a decline in mental status but has improved after administration of mannitol .  He has 1.2 cm of right-to-left midline shift due to brain compression from the subdural hematoma.  He has a current Glasgow Coma Scale of 14 but had worsened to 13 prior to mannitol .  Given the size of his subdural hematoma and his worsening mental status prior to administration of mannitol , I feel that he requires urgent craniotomy for evacuation of subdural hematoma.  I we will work with the operating room to facilitate this.  He has already received Kcentra  to reverse his anticoagulation status.  I discussed the planned procedure at length with the patient and his family, including the risks, benefits, alternatives, and indications. The risks discussed include but are not limited to bleeding, infection, need for reoperation, spinal fluid leak, stroke, vision loss, anesthetic complication, coma, paralysis, and even death. I also described in detail that improvement was not guaranteed.  The patient and his family expressed understanding of these risks, and asked that we proceed with surgery. I described the surgery in layman's terms, and gave ample opportunity for questions, which were answered to the best of my ability.  We will proceed under emergency basis.  He will be admitted to the intensive care unit  postoperatively for close neurological monitoring.     I have communicated my recommendations to the requesting physician and coordinated care to facilitate these recommendations.     Irelynd Zumstein K. Clois MD, [facility]     [1]  No outpatient medications have been marked as taking for the 05/16/24 encounter [facility] Encounter).  [2]  Social History Tobacco Use   Smoking status: Never   Smokeless tobacco: Never  Vaping Use   Vaping status: Never Used  Substance Use Topics   Alcohol use: Not Currently    Comment: occasional   Drug use: No   "

## 2024-05-16 NOTE — H&P (View-Only) (Signed)
 "   Consult requested by:  Dr. Fernand  Consult requested for:  Subdural hematoma  Primary Physician:  Cleotilde Dominic FALCON, MD  History of Present Illness: 05/16/2024 [name] is here with a chief complaint of fall.  He had a mechanical fall in the eyes.  There was no loss of consciousness.  He presented to the emergency department where he was found to have a large subdural hematoma.  He began declining and required administration of mannitol .  He complains of headache currently.  Otherwise, he has no complaints currently.  Of note, he is on Eliquis for atrial fibrillation.  I have utilized the care everywhere function in epic to review the outside records available from external health systems.  Review of Systems:  A 10 point review of systems is negative, except for the pertinent positives and negatives detailed in the HPI.  Past Medical History: Past Medical History:  Diagnosis Date   Anemia    Anxiety    Bleeding disorder    Dysrhythmia/Atrial fib    Elevated PSA    History of atrial fibrillation    Hypertension    PVC (premature ventricular contraction)    Thrombocytopenia    Tubular adenoma     Past Surgical History: Past Surgical History:  Procedure Laterality Date   AORTA ANEURYSM REPAIR     CARDIAC VALVE REPLACEMENT     CARDIOVERSION N/A 02/22/2024   Procedure: CARDIOVERSION;  Surgeon: Wilburn Keller BROCKS, MD;  Location: [facility] ORS;  Service: Cardiovascular;  Laterality: N/A;   COLONOSCOPY N/A 05/01/2024   Procedure: COLONOSCOPY;  Surgeon: Toledo, Ladell POUR, MD;  Location: [facility] ENDOSCOPY;  Service: Gastroenterology;  Laterality: N/A;   COLONOSCOPY WITH PROPOFOL  N/A 10/01/2018   Procedure: COLONOSCOPY WITH PROPOFOL ;  Surgeon: Viktoria Lamar DASEN, MD;  Location: [facility] ENDOSCOPY;  Service: Endoscopy;  Laterality: N/A;   ESOPHAGEAL DILATION  11/23/2022   Procedure: ESOPHAGEAL DILATION;  Surgeon: Toledo, Teodoro K, MD;  Location: [facility] ENDOSCOPY;  Service: Gastroenterology;;    ESOPHAGOGASTRODUODENOSCOPY (EGD) WITH PROPOFOL  N/A 11/23/2022   Procedure: ESOPHAGOGASTRODUODENOSCOPY (EGD) WITH PROPOFOL ;  Surgeon: Toledo, Ladell POUR, MD;  Location: [facility] ENDOSCOPY;  Service: Gastroenterology;  Laterality: N/A;   TEE WITHOUT CARDIOVERSION N/A 02/22/2024   Procedure: ECHOCARDIOGRAM, TRANSESOPHAGEAL;  Surgeon: Alluri, Keller BROCKS, MD;  Location: [facility] ORS;  Service: Cardiovascular;  Laterality: N/A;   TONSILLECTOMY      Allergies: Allergies as of 05/16/2024 - Review Complete 05/16/2024  Allergen Reaction Noted   Quinolones  01/15/2021    Medications: Active Medications[1]  Social History: Social History[2]  Family Medical History: Family History  Problem Relation Age of Onset   Bladder Cancer Neg Hx    Prostate cancer Neg Hx    Kidney cancer Neg Hx     Physical Examination: Vitals:   05/16/24 1855 05/16/24 1900  BP:  (!) 150/80  Pulse: 66 69  Resp: 20 (!) 26  Temp:    SpO2: 100% 100%    General: Patient is in no apparent distress. Attention to examination is appropriate.  Neck:   Supple.  Full range of motion.  Respiratory: Patient is breathing without any difficulty.  He has bruising around his left eye.  He has a small abrasion above his left eyebrow.   NEUROLOGICAL:     Awake, alert, oriented to person, place, and time.  Speech is slow but clear and fluent.  Cranial Nerves: Pupils equal round and reactive to light.  Facial tone is symmetric.  Facial sensation is symmetric. Shoulder shrug  is symmetric. Tongue protrusion is midline.  There is no pronator drift.  Strength: Side Biceps Triceps Deltoid Interossei Grip Wrist Ext. Wrist Flex.  R 5 5 5 5 5 5 5   L 5 5 5 5 5 5 5    Side Iliopsoas Quads Hamstring PF DF EHL  R 5 5 5 5 5 5   L 5 5 5 5 5 5    Bilateral upper and lower extremity sensation is intact to light touch.    No evidence of dysmetria noted.  Gait is untested.     Medical Decision Making  Imaging: CT Head  05/16/2024  IMPRESSION: 1. Acute right cerebral convexity subdural hematoma measuring up to 1.8 cm with associated mass effect, effacement of the right lateral ventricle, and right to left midline shift measuring 1.2 cm. The findings of this exam were called to Dr. Dorothyann at the time of exam interpretation.   Electronically signed by: Dominic Devonshire MD 05/16/2024 05:48 PM EST RP Workstation: [id number]   I have personally reviewed the images and agree with the above interpretation.  Assessment and Plan: [name] is a pleasant 79 y.o. male with large right subdural hematoma.  He had a decline in mental status but has improved after administration of mannitol .  He has 1.2 cm of right-to-left midline shift due to brain compression from the subdural hematoma.  He has a current Glasgow Coma Scale of 15 but had worsened to 14 prior to mannitol .  Given the size of his subdural hematoma and his worsening mental status prior to administration of mannitol , I feel that he requires urgent craniotomy for evacuation of subdural hematoma.  I we will work with the operating room to facilitate this.  He has already received Kcentra  to reverse his anticoagulation status.  I discussed the planned procedure at length with the patient and his family, including the risks, benefits, alternatives, and indications. The risks discussed include but are not limited to bleeding, infection, need for reoperation, spinal fluid leak, stroke, vision loss, anesthetic complication, coma, paralysis, and even death. I also described in detail that improvement was not guaranteed.  The patient and his family expressed understanding of these risks, and asked that we proceed with surgery. I described the surgery in layman's terms, and gave ample opportunity for questions, which were answered to the best of my ability.  We will proceed under emergency basis.  He will be admitted to the intensive care unit postoperatively for close  neurological monitoring.     I have communicated my recommendations to the requesting physician and coordinated care to facilitate these recommendations.     Dominic Osborn K. Clois MD, [facility] Neurosurgery     [1]  No outpatient medications have been marked as taking for the 05/16/24 encounter [facility] Encounter).  [2]  Social History Tobacco Use   Smoking status: Never   Smokeless tobacco: Never  Vaping Use   Vaping status: Never Used  Substance Use Topics   Alcohol use: Not Currently    Comment: occasional   Drug use: No   "

## 2024-05-16 NOTE — ED Provider Notes (Signed)
 "  [facility] Provider Note    Event Date/Time   First MD Initiated Contact with Patient 05/16/24 1747     (approximate)   History   Fall   HPI  Dominic Osborn is a 79 y.o. male presents with a mechanical slip and fall in ice.  Unclear exactly when believed to be between 3 and 4 PM, he was able to call his daughter who picked him up and brought him to the emergency department.  Upon initial arrival seems that he was able to ambulate independently and fairly conversive, however has a gradual decline in mentation.  Was contacted by radiology while patient was in the waiting room informing of CT scan concerning for subdural hemorrhage.  Upon arrival, patient is able to provide further history complaining primarily of a left-sided headache and some left wrist pain denies any injury elsewhere and has no other complaints at this time.  Daughter informs me that he has had declining mentation since his arrival here, he was recently started on Eliquis for A-fib but he tells me that he has been in a sinus rhythm primarily for most of the time since he was cardioverted.  She does mention that his goals of care are full code at this time.  Patient states that he did take the Eliquis this morning.     Physical Exam   Triage Vital Signs: ED Triage Vitals  Encounter Vitals Group     BP 05/16/24 1625 (!) 143/89     Girls Systolic BP Percentile --      Girls Diastolic BP Percentile --      Boys Systolic BP Percentile --      Boys Diastolic BP Percentile --      Pulse Rate 05/16/24 1625 70     Resp 05/16/24 1625 18     Temp 05/16/24 1625 98 F (36.7 C)     Temp src --      SpO2 05/16/24 1625 97 %     Weight 05/16/24 1623 220 lb (99.8 kg)     Height 05/16/24 1623 6' 4 (1.93 m)     Head Circumference --      Peak Flow --      Pain Score 05/16/24 1623 2     Pain Loc --      Pain Education --      Exclude from Growth Chart --     Most recent vital signs: Vitals:    05/16/24 1900 05/16/24 1928  BP: (!) 150/80 (!) 141/78  Pulse: 69 72  Resp: (!) 26 (!) 22  Temp:  97.7 F (36.5 C)  SpO2: 100%      General: Easily arousable answering questions appropriately alert and oriented x 3 CV:  Good peripheral perfusion.  Resp:  Normal effort.  No evidence of increased respiratory effort Abd:  No distention.  Abdomen soft nontender MSK:  Ecchymoses appreciated on the left dorsal hand as well as the left elbow, no noted snuffbox tenderness good range of motion of the elbow and hand appropriate strength Neuro:  GCS 14, closing eyes in exam room but will open to verbal stimuli, answering questions appropriately, moves extremities to command  Other:     ED Results / Procedures / Treatments   Labs (all labs ordered are listed, but only abnormal results are displayed) Labs Reviewed  PROTIME-INR - Abnormal; Notable for the following components:      Result Value   Prothrombin  Time 16.9 (*)  INR 1.3 (*)    All other components within normal limits  BASIC METABOLIC PANEL WITH GFR - Abnormal; Notable for the following components:   Glucose, Bld 133 (*)    BUN 35 (*)    Creatinine, Ser 1.53 (*)    GFR, Estimated 47 (*)    All other components within normal limits  CBC - Abnormal; Notable for the following components:   WBC 10.7 (*)    Hemoglobin 12.9 (*)    HCT 38.6 (*)    All other components within normal limits  APTT  TYPE AND SCREEN     EKG  On my independent interpretation of this EKG appears to be a sinus rhythm with a rate of about 70, axis of about 70, intervals appear to be within normal limits, no obvious ischemia appreciated on this EKG   RADIOLOGY Large subdural was appreciated with evidence of midline shift  PROCEDURES:  Critical Care performed: Yes, see critical care procedure note(s)  .Critical Care  Performed by: Fernand Rossie HERO, MD Authorized by: Fernand Rossie HERO, MD   Critical care provider statement:    Critical care  time (minutes):  45   Critical care was necessary to treat or prevent imminent or life-threatening deterioration of the following conditions:  Shock, trauma and CNS failure or compromise   Critical care was time spent personally by me on the following activities:  Development of treatment plan with patient or surrogate, discussions with consultants, evaluation of patient's response to treatment, examination of patient, ordering and review of laboratory studies, ordering and review of radiographic studies, ordering and performing treatments and interventions, pulse oximetry, re-evaluation of patient's condition and review of old charts    MEDICATIONS ORDERED IN ED: Medications  Prothrombin  Complex Conc Human IVPB 4,967 Units (4,967 Units Intravenous New Bag/Given 05/16/24 1856)  acetaminophen  (OFIRMEV ) IV 1,000 mg (0 mg Intravenous Stopped 05/16/24 1848)  mannitol  20 % infusion 12.5 g (0 g Intravenous Stopped 05/16/24 1857)  ondansetron  (ZOFRAN ) injection 4 mg (4 mg Intravenous Given 05/16/24 1827)  Tdap (ADACEL ) injection 0.5 mL (0.5 mLs Intramuscular Given 05/16/24 1828)     IMPRESSION / MDM / ASSESSMENT AND PLAN / ED COURSE  I reviewed the triage vital signs and the nursing notes.                               Patient's presentation is most consistent with acute presentation with potential threat to life or bodily function.  79 year old male who presents from a fall on ice, found to have a large subdural hematoma, will reverse his Eliquis as he did take it today.  Given his declining mental status will give a dose of mannitol , will reach out to neurosurgery for further recommendations and assessment.  We have placed the patient's head of bed elevated at this time, blood pressure fortunately seems appropriately controlled for now.   Clinical Course as of 05/16/24 1940  Thu May 16, 2024  1814 Spoke with Dr. Katrina from neurosurgery who is planning to come assess the patient at bedside.  [SK]  1923 Spoke with ICU, they will assess the patient once patient is out of the OR. [SK]    Clinical Course User Index [SK] Fernand Rossie HERO, MD     FINAL CLINICAL IMPRESSION(S) / ED DIAGNOSES   Final diagnoses:  Subdural hematoma (HCC)  Contusion of face, initial encounter     Rx / DC Orders  ED Discharge Orders     None        Note:  This document was prepared using Dragon voice recognition software and may include unintentional dictation errors.   Fernand Rossie HERO, MD 05/16/24 1940  "

## 2024-05-16 NOTE — Anesthesia Procedure Notes (Signed)
 Procedure Name: Intubation Date/Time: 05/16/2024 7:57 PM  Performed by: Landy Francena BIRCH, CRNAPre-anesthesia Checklist: Patient identified, Emergency Drugs available, Suction available and Patient being monitored Patient Re-evaluated:Patient Re-evaluated prior to induction Oxygen Delivery Method: Circle system utilized Preoxygenation: Pre-oxygenation with 100% oxygen Induction Type: IV induction Laryngoscope Size: McGrath and 4 Grade View: Grade I Tube type: Oral Tube size: 7.5 mm Number of attempts: 1 Airway Equipment and Method: Stylet, Oral airway and Bite block Placement Confirmation: ETT inserted through vocal cords under direct vision, positive ETCO2 and breath sounds checked- equal and bilateral Secured at: 24 cm Tube secured with: Tape Dental Injury: Teeth and Oropharynx as per pre-operative assessment

## 2024-05-16 NOTE — ED Notes (Signed)
Neurosurgeon at bedside °

## 2024-05-16 NOTE — H&P (Signed)
 "  NAME:  Dominic Osborn, MRN:  [id number], DOB:  [birth year], LOS: 0 ADMISSION DATE:  05/16/2024, CONSULTATION DATE:  05/16/24 REFERRING MD:  Dr. Fernand, CHIEF COMPLAINT:  Fall  Brief Synopsis:  79 year old male with a past medical history significant for hypertension and atrial fibrillation on eliquis admitted for a subdural hematoma with midline shift s/p craniotomy.  History of Present Illness:  Dominic Osborn is a 79 year old male with a pmh of hypertension and atrial fibrillation on eliquis who presented to [facility] ED following a fall. It was reported that he fell between 3-4pm on 05/16/24. He was able to contact his daughter to pick him up and promptly brought him to the ED.   History gathered per chart review and evaluation at bedside It was reported that upon arrival to [facility] ED initially, he was able to ambulate independently and was conversing with family and hospital staff.   ED course: Upon arrival the patient reported a left-sided headache with wrist pain following his fall. He was taken for CT scan. While waiting for CT results, he mental status began to decline. Radiology contacted the EDP with concern for subdural hemorrhage. Per his daughter he is compliant with his medications and took eliquis the morning of his fall.  Vitals on Arrival: Temp: 98.16F BP: 143/89  HR: 70  RR: 18  SpO2: 97% on room air  Pertinent Labs/Diagnostics: Hyperglycemia with an AKI, mild anemia  I, Robet Kim, PA-C personally viewed and interpreted this ECG. EKG Interpretation: Date: 1/29, EKG Time: 1811, Rate: 70, Rhythm: sinus rhythm, QRS Axis:  normal Intervals: normal, ST/T Wave abnormalities: no acute signs of ischemia, Narrative Interpretation: normal ekg  Chemistry:  Na+:143 K+: 3.9 BUN/Cr.: 35/1.53 Serum CO2/ AG: 24/13 Glucose: 133  Hematology:  WBC: 10.7 Hgb: 12.9 Platelets 156 INR: 1.3  ID: MRSA PCR: negative  Xray of Left Wrist Impression:  1. Degenerative change without acute  abnormality.   CT C-Spine Impression: 1. Degenerative change without acute abnormality.   CT Head Impression:  1. Acute right cerebral convexity subdural hematoma measuring up to 1.8 cm with associated mass effect, effacement of the right lateral ventricle, and right to left midline shift measuring 1.2 cm.   PCCM consulted for admission due to subdural hematoma requiring emergent craniotomy and frequent neuro checks.   Pertinent Medical History   Past Medical History:  Diagnosis Date   Anemia    Anxiety    Bleeding disorder    Dysrhythmia/Atrial fib    Elevated PSA    History of atrial fibrillation    Hypertension    PVC (premature ventricular contraction)    Thrombocytopenia    Tubular adenoma      Significant Hospital Events: Including procedures, antibiotic start and stop dates in addition to other pertinent events   1/29: Admitted following a fall, found to have a subdural hematoma with midline shift requiring surgical intervention. PCCM consulted for admission.  Interim History / Subjective:  Patient taken emergently to the OR for craniotomy. Recovering well in the PACU; extubated and not requiring vasopressors. Per Dr. Clois, the procedure was tolerated well by the patient.  Objective    Blood pressure (!) 107/57, pulse 66, temperature 98 F (36.7 C), resp. rate 17, height 6' 4 (1.93 m), weight 99.8 kg, SpO2 99%.        Intake/Output Summary (Last 24 hours) at 05/16/2024 2350 Last data filed at 05/16/2024 2333 Gross per 24 hour  Intake 1345 ml  Output  690 ml  Net 655 ml   Filed Weights   05/16/24 1623  Weight: 99.8 kg    Examination: General: Adult male, acutely ill, lying in bed s/p right craniotomy, in NAD HENT: anicteric sclerae, atraumatic, normocephalic, neck supple, no JVD CV: RRR, S1 S2, NSR on monitor, no r/m/g Pulm: Regular, non labored on room air, breath sounds equal throughout GI: soft, non-tender, non-distended, no rebound/guarding,  bowel sounds x 4 Extremities: warm/dry, pulses + 2 R/P, no edema noted Neuro: A&O x 4, follows commands, no focal neuro deficits, PERRL   GU: deferred   Resolved problem list   Assessment and Plan   #Subdural Hematoma S/P Right Craniotomy - Goal SBP < 160 using prn Labetolol or Nicardipine Gtt if needed - HOB >= 30 degrees with aspiration precautions - Q1 neuro checks until repeat CT in the am - Repeat Head CT in the morning - Stat head CT and call Neurosurgery if acute change - Seizure precautions - Keppra  500mg  BID - Pain control - No HepSQ, antiplatelet or anticoagulant agents at this time - Hold DVT prophylaxis until repeat CT performed - Maintain strict euglycemia, euthermia, and euvolemia  - Advance diet as tolerated and once swallow screen passed - PPx: SCDs for now, Senna/docusate, PPI  #Hypertension #Atrial Fibrillation on Eliquis - Continuous cardiac monitoring - Vasopressors to maintain MAP goal >65 ~ not requiring - IV antihypertensives prn for SBP >160 - EKG prn - Hold home meds for now  #Acute Kidney Injury - Strict I/Os - Inform provider if UO is < 0.5 mL/kg/h - Trend BMP, Mg and Phosphorus - Avoid nephrotoxins as able - Ensure adequate renal perfusion - ICU electrolyte replacement protocol - Pharmacy to assist with replacement as indicated - Diuresis - Nephrology consult/following  #Hyperglycemia - ICU hypo/hyperglycemia protocol - CBG q4h - SSI: Novolog  as indicated - Goal Range: 140-180   Labs   CBC: Recent Labs  Lab 05/16/24 1810  WBC 10.7*  HGB 12.9*  HCT 38.6*  MCV 88.7  PLT 156    Basic Metabolic Panel: Recent Labs  Lab 05/16/24 1810  NA 143  K 3.9  CL 106  CO2 24  GLUCOSE 133*  BUN 35*  CREATININE 1.53*  CALCIUM 9.4   GFR: Estimated Creatinine Clearance: 49.6 mL/min (A) (by C-G formula based on SCr of 1.53 mg/dL (H)). Recent Labs  Lab 05/16/24 1810  WBC 10.7*    Liver Function Tests: No results for input(s):  AST, ALT, ALKPHOS, BILITOT, PROT, ALBUMIN in the last 168 hours. No results for input(s): LIPASE, AMYLASE in the last 168 hours. No results for input(s): AMMONIA in the last 168 hours.  ABG No results found for: PHART, PCO2ART, PO2ART, HCO3, TCO2, ACIDBASEDEF, O2SAT   Coagulation Profile: Recent Labs  Lab 05/16/24 1810  INR 1.3*    Cardiac Enzymes: No results for input(s): CKTOTAL, CKMB, CKMBINDEX, TROPONINI in the last 168 hours.  HbA1C: No results found for: HGBA1C  CBG: No results for input(s): GLUCAP in the last 168 hours.  Review of Systems:   Review of Systems  Respiratory:  Negative for shortness of breath and wheezing.   Cardiovascular:  Negative for chest pain and palpitations.  Gastrointestinal:  Negative for nausea and vomiting.  Neurological:  Negative for tingling, tremors, sensory change, speech change, focal weakness, seizures, weakness and headaches.     Past Medical History:  He,  has a past medical history of Anemia, Anxiety, Bleeding disorder, Dysrhythmia/Atrial fib, Elevated PSA, History of atrial fibrillation,  Hypertension, PVC (premature ventricular contraction), Thrombocytopenia, and Tubular adenoma.   Surgical History:   Past Surgical History:  Procedure Laterality Date   AORTA ANEURYSM REPAIR     CARDIAC VALVE REPLACEMENT     CARDIOVERSION N/A 02/22/2024   Procedure: CARDIOVERSION;  Surgeon: Wilburn Keller BROCKS, MD;  Location: [facility] ORS;  Service: Cardiovascular;  Laterality: N/A;   COLONOSCOPY N/A 05/01/2024   Procedure: COLONOSCOPY;  Surgeon: Toledo, Ladell POUR, MD;  Location: [facility] ENDOSCOPY;  Service: Gastroenterology;  Laterality: N/A;   COLONOSCOPY WITH PROPOFOL  N/A 10/01/2018   Procedure: COLONOSCOPY WITH PROPOFOL ;  Surgeon: Viktoria Lamar DASEN, MD;  Location: [facility] ENDOSCOPY;  Service: Endoscopy;  Laterality: N/A;   ESOPHAGEAL DILATION  11/23/2022   Procedure: ESOPHAGEAL DILATION;  Surgeon: [name],  Teodoro K, MD;  Location: [facility] ENDOSCOPY;  Service: Gastroenterology;;   ESOPHAGOGASTRODUODENOSCOPY (EGD) WITH PROPOFOL  N/A 11/23/2022   Procedure: ESOPHAGOGASTRODUODENOSCOPY (EGD) WITH PROPOFOL ;  Surgeon: Toledo, Ladell POUR, MD;  Location: [facility] ENDOSCOPY;  Service: Gastroenterology;  Laterality: N/A;   TEE WITHOUT CARDIOVERSION N/A 02/22/2024   Procedure: ECHOCARDIOGRAM, TRANSESOPHAGEAL;  Surgeon: Alluri, Keller BROCKS, MD;  Location: [facility] ORS;  Service: Cardiovascular;  Laterality: N/A;   TONSILLECTOMY       Social History:   reports that he has never smoked. He has never used smokeless tobacco. He reports that he does not currently use alcohol. He reports that he does not use drugs.   Family History:  His family history is negative for Bladder Cancer, Prostate cancer, and Kidney cancer.   Allergies Allergies[1]   Home Medications  Prior to Admission medications  Medication Sig Start Date End Date Taking? Authorizing Provider  acetaminophen  (TYLENOL ) 650 MG CR tablet Take 650 mg by mouth every 8 (eight) hours as needed for pain.    [provider]  amLODipine (NORVASC) 10 MG tablet Take 10 mg by mouth at bedtime. 02/20/24   [provider]  apixaban (ELIQUIS) 5 MG TABS tablet Take 5 mg by mouth 2 (two) times daily. 02/08/24   [provider]  aspirin EC 81 MG tablet Take 81 mg by mouth daily. Swallow whole.    [provider]  calcium citrate (CALCITRATE - DOSED IN MG ELEMENTAL CALCIUM) 950 (200 Ca) MG tablet Take 200 mg of elemental calcium by mouth daily.    [provider]  calcium-vitamin D (OSCAL WITH D) 500-200 MG-UNIT tablet Take 1 tablet by mouth daily at 12 noon.    [provider]  diphenhydrAMINE (BENADRYL) 50 MG capsule Take 50 mg by mouth daily at 12 noon.    [provider]  Magnesium Oxide 500 MG CAPS Take 1 capsule by mouth daily at 12 noon.    [provider]  metoprolol  succinate (TOPROL -XL) 50 MG 24 hr  tablet Take 50 mg by mouth See admin instructions. 50mg  in the morning and 25mg  at night    [provider]  Multiple Vitamin (MULTI-VITAMINS) TABS Take 1 tablet by mouth daily at 12 noon.    [provider]  pantoprazole (PROTONIX) 40 MG tablet Take 40 mg by mouth 2 (two) times daily.    [provider]  POTASSIUM GLUCONATE PO Take 1 tablet by mouth daily at 12 noon.    [provider]  tadalafil  (CIALIS ) 20 MG tablet Take 0.5 tablets (10 mg total) by mouth daily as needed for erectile dysfunction. 03/08/23 03/08/24  Stoioff, Glendia BROCKS, MD  telmisartan (MICARDIS) 80 MG tablet Take 40 mg by mouth daily.    [provider]  torsemide  (DEMADEX ) 10 MG tablet Take 5 mg by mouth daily at 12 noon. 02/15/24   [provider]  VITAMIN D PO Take 1 tablet by mouth daily at 12 noon.    [provider]     Critical care time: 65 minutes     Ericca Labra, PA-C [facility] P[facility]M Team Contact Info: [phone]         [1]  Allergies Allergen Reactions   Quinolones     Other reaction(s): Other (See Comments) Fluroquinolone antibiotics should be avoided in patients with history of aortic aneurysm/dissection    "

## 2024-05-17 ENCOUNTER — Encounter
Admission: EM | Disposition: A | Discharge status: Skilled Nursing Facility | Payer: Self-pay | Source: Home / Self Care | Attending: Pulmonary Disease

## 2024-05-17 ENCOUNTER — Inpatient Hospital Stay

## 2024-05-17 ENCOUNTER — Inpatient Hospital Stay: Admitting: Anesthesiology

## 2024-05-17 ENCOUNTER — Other Ambulatory Visit: Payer: Self-pay

## 2024-05-17 ENCOUNTER — Encounter: Payer: Self-pay | Admitting: Neurosurgery

## 2024-05-17 DIAGNOSIS — I480 Paroxysmal atrial fibrillation: Secondary | ICD-10-CM

## 2024-05-17 DIAGNOSIS — S064XAA Epidural hemorrhage with loss of consciousness status unknown, initial encounter: Secondary | ICD-10-CM

## 2024-05-17 DIAGNOSIS — S065XAA Traumatic subdural hemorrhage with loss of consciousness status unknown, initial encounter: Secondary | ICD-10-CM | POA: Diagnosis not present

## 2024-05-17 LAB — GLUCOSE, CAPILLARY
Glucose-Capillary: 179 mg/dL — ABNORMAL HIGH (ref 70–99)
Glucose-Capillary: 180 mg/dL — ABNORMAL HIGH (ref 70–99)
Glucose-Capillary: 154 mg/dL — ABNORMAL HIGH (ref 70–99)
Glucose-Capillary: 135 mg/dL — ABNORMAL HIGH (ref 70–99)
Glucose-Capillary: 166 mg/dL — ABNORMAL HIGH (ref 70–99)
Glucose-Capillary: 181 mg/dL — ABNORMAL HIGH (ref 70–99)
Glucose-Capillary: 157 mg/dL — ABNORMAL HIGH (ref 70–99)

## 2024-05-17 LAB — HEMOGLOBIN A1C
Hgb A1c MFr Bld: 5.6 % (ref 4.8–5.6)
Mean Plasma Glucose: 114.02 mg/dL

## 2024-05-17 LAB — CBC
HCT: 35 % — ABNORMAL LOW (ref 39.0–52.0)
Hemoglobin: 11.3 g/dL — ABNORMAL LOW (ref 13.0–17.0)
MCH: 29.1 pg (ref 26.0–34.0)
MCHC: 32.3 g/dL (ref 30.0–36.0)
MCV: 90.2 fL (ref 80.0–100.0)
Platelets: 131 10*3/uL — ABNORMAL LOW (ref 150–400)
RBC: 3.88 MIL/uL — ABNORMAL LOW (ref 4.22–5.81)
RDW: 13.2 % (ref 11.5–15.5)
WBC: 12 10*3/uL — ABNORMAL HIGH (ref 4.0–10.5)
nRBC: 0 % (ref 0.0–0.2)

## 2024-05-17 LAB — BASIC METABOLIC PANEL WITH GFR
Anion gap: 12 (ref 5–15)
BUN: 33 mg/dL — ABNORMAL HIGH (ref 8–23)
CO2: 24 mmol/L (ref 22–32)
Calcium: 9 mg/dL (ref 8.9–10.3)
Chloride: 104 mmol/L (ref 98–111)
Creatinine, Ser: 1.34 mg/dL — ABNORMAL HIGH (ref 0.61–1.24)
GFR, Estimated: 55 mL/min — ABNORMAL LOW
Glucose, Bld: 180 mg/dL — ABNORMAL HIGH (ref 70–99)
Potassium: 4.1 mmol/L (ref 3.5–5.1)
Sodium: 140 mmol/L (ref 135–145)

## 2024-05-17 LAB — MAGNESIUM: Magnesium: 2.2 mg/dL (ref 1.7–2.4)

## 2024-05-17 LAB — PROTIME-INR
INR: 1.1 (ref 0.8–1.2)
Prothrombin Time: 14.8 s (ref 11.4–15.2)

## 2024-05-17 LAB — PHOSPHORUS: Phosphorus: 4 mg/dL (ref 2.5–4.6)

## 2024-05-17 LAB — MRSA NEXT GEN BY PCR, NASAL: MRSA by PCR Next Gen: NOT DETECTED

## 2024-05-17 MED ORDER — ONDANSETRON HCL 4 MG/2ML IJ SOLN
INTRAMUSCULAR | Status: DC | PRN
  Administered 2024-05-17: 4 mg via INTRAVENOUS

## 2024-05-17 MED ORDER — LEVETIRACETAM (KEPPRA) 500 MG/5 ML ADULT IV PUSH
500.0000 mg | Freq: Two times a day (BID) | INTRAVENOUS | Status: DC
  Administered 2024-05-17 – 2024-05-18 (×5): 500 mg via INTRAVENOUS
  Filled 2024-05-17 (×7): qty 5

## 2024-05-17 MED ORDER — SODIUM CHLORIDE 0.9% IV SOLUTION: Freq: Once | INTRAVENOUS | Status: AC

## 2024-05-17 MED ORDER — FENTANYL CITRATE (PF) 100 MCG/2ML IJ SOLN: 25.0000 ug | INTRAMUSCULAR | Status: DC | PRN

## 2024-05-17 MED ORDER — POLYETHYLENE GLYCOL 3350 17 G PO PACK
17.0000 g | PACK | Freq: Every day | ORAL | Status: AC | PRN
  Administered 2024-05-19: 17 g via ORAL
  Filled 2024-05-17: qty 1

## 2024-05-17 MED ORDER — LEVETIRACETAM 500 MG PO TABS: 500.0000 mg | ORAL_TABLET | Freq: Two times a day (BID) | ORAL | Status: DC

## 2024-05-17 MED ORDER — CEFAZOLIN SODIUM-DEXTROSE 2-3 GM-%(50ML) IV SOLR
INTRAVENOUS | Status: DC | PRN
  Administered 2024-05-17: 2 g via INTRAVENOUS

## 2024-05-17 MED ORDER — ACETAMINOPHEN 325 MG PO TABS
650.0000 mg | ORAL_TABLET | Freq: Four times a day (QID) | ORAL | Status: DC | PRN
  Administered 2024-05-17: 650 mg via ORAL
  Filled 2024-05-17: qty 2

## 2024-05-17 MED ORDER — MANNITOL 20 % IV SOLN
50.0000 g | Freq: Once | INTRAVENOUS | Status: AC
  Administered 2024-05-17: 50 g via INTRAVENOUS
  Filled 2024-05-17: qty 250

## 2024-05-17 MED ORDER — FENTANYL CITRATE (PF) 100 MCG/2ML IJ SOLN
INTRAMUSCULAR | Status: AC
  Filled 2024-05-17: qty 2

## 2024-05-17 MED ORDER — PHENYLEPHRINE HCL-NACL 20-0.9 MG/250ML-% IV SOLN
INTRAVENOUS | Status: AC
  Filled 2024-05-17: qty 250

## 2024-05-17 MED ORDER — IRBESARTAN 150 MG PO TABS
150.0000 mg | ORAL_TABLET | Freq: Every day | ORAL | Status: DC
  Administered 2024-05-17 – 2024-05-20 (×4): 150 mg via ORAL
  Filled 2024-05-17 (×4): qty 1

## 2024-05-17 MED ORDER — SUGAMMADEX SODIUM 200 MG/2ML IV SOLN
INTRAVENOUS | Status: DC | PRN
  Administered 2024-05-17 (×2): 100 mg via INTRAVENOUS

## 2024-05-17 MED ORDER — HYDROMORPHONE HCL 1 MG/ML IJ SOLN
0.5000 mg | Freq: Once | INTRAMUSCULAR | Status: AC
  Administered 2024-05-17: 0.5 mg via INTRAVENOUS
  Filled 2024-05-17 (×2): qty 1

## 2024-05-17 MED ORDER — LACTATED RINGERS IV SOLN: INTRAVENOUS | Status: DC | PRN

## 2024-05-17 MED ORDER — CHLORHEXIDINE GLUCONATE CLOTH 2 % EX PADS
6.0000 | MEDICATED_PAD | Freq: Every day | CUTANEOUS | Status: DC
  Administered 2024-05-17 – 2024-05-21 (×5): 6 via TOPICAL

## 2024-05-17 MED ORDER — PHENYLEPHRINE 80 MCG/ML (10ML) SYRINGE FOR IV PUSH (FOR BLOOD PRESSURE SUPPORT)
PREFILLED_SYRINGE | INTRAVENOUS | Status: DC | PRN
  Administered 2024-05-17 (×2): 80 ug via INTRAVENOUS
  Administered 2024-05-17: 160 ug via INTRAVENOUS
  Administered 2024-05-17: 80 ug via INTRAVENOUS
  Administered 2024-05-17: 240 ug via INTRAVENOUS
  Administered 2024-05-17: 80 ug via INTRAVENOUS

## 2024-05-17 MED ORDER — PHENYLEPHRINE HCL-NACL 20-0.9 MG/250ML-% IV SOLN
INTRAVENOUS | Status: DC | PRN
  Administered 2024-05-17: 20 ug/min via INTRAVENOUS

## 2024-05-17 MED ORDER — METOPROLOL SUCCINATE ER 50 MG PO TB24
50.0000 mg | ORAL_TABLET | Freq: Every morning | ORAL | Status: AC
  Administered 2024-05-17 – 2024-05-24 (×8): 50 mg via ORAL
  Filled 2024-05-17 (×8): qty 1

## 2024-05-17 MED ORDER — SURGIFLO WITH THROMBIN (HEMOSTATIC MATRIX KIT) OPTIME
TOPICAL | Status: DC | PRN
  Administered 2024-05-17: 1 via TOPICAL

## 2024-05-17 MED ORDER — LIDOCAINE HCL (PF) 2 % IJ SOLN
INTRAMUSCULAR | Status: AC
  Filled 2024-05-17: qty 5

## 2024-05-17 MED ORDER — BACITRACIN 500 UNIT/GM EX OINT
TOPICAL_OINTMENT | CUTANEOUS | Status: DC | PRN
  Administered 2024-05-17: 1 via TOPICAL

## 2024-05-17 MED ORDER — METOPROLOL SUCCINATE ER 50 MG PO TB24
25.0000 mg | ORAL_TABLET | Freq: Every day | ORAL | Status: AC
  Administered 2024-05-17 – 2024-05-24 (×8): 25 mg via ORAL
  Filled 2024-05-17 (×8): qty 1

## 2024-05-17 MED ORDER — SUCCINYLCHOLINE CHLORIDE 200 MG/10ML IV SOSY
PREFILLED_SYRINGE | INTRAVENOUS | Status: DC | PRN
  Administered 2024-05-17: 120 mg via INTRAVENOUS

## 2024-05-17 MED ORDER — FENTANYL CITRATE (PF) 100 MCG/2ML IJ SOLN
INTRAMUSCULAR | Status: DC | PRN
  Administered 2024-05-17: 100 ug via INTRAVENOUS
  Administered 2024-05-17: 25 ug via INTRAVENOUS

## 2024-05-17 MED ORDER — DEXMEDETOMIDINE HCL IN NACL 200 MCG/50ML IV SOLN
INTRAVENOUS | Status: DC | PRN
  Administered 2024-05-17: 4 ug via INTRAVENOUS
  Administered 2024-05-17: 8 ug via INTRAVENOUS

## 2024-05-17 MED ORDER — 0.9 % SODIUM CHLORIDE (POUR BTL) OPTIME
TOPICAL | Status: DC | PRN
  Administered 2024-05-17: 500 mL

## 2024-05-17 MED ORDER — LIDOCAINE HCL (CARDIAC) PF 100 MG/5ML IV SOSY
PREFILLED_SYRINGE | INTRAVENOUS | Status: DC | PRN
  Administered 2024-05-17: 100 mg via INTRAVENOUS

## 2024-05-17 MED ORDER — SENNA 8.6 MG PO TABS
1.0000 | ORAL_TABLET | Freq: Two times a day (BID) | ORAL | Status: AC | PRN
  Administered 2024-05-19 (×2): 8.6 mg via ORAL
  Filled 2024-05-17 (×2): qty 1

## 2024-05-17 MED ORDER — ACETAMINOPHEN 500 MG PO TABS
1000.0000 mg | ORAL_TABLET | Freq: Four times a day (QID) | ORAL | Status: AC
  Administered 2024-05-17 – 2024-05-24 (×26): 1000 mg via ORAL
  Filled 2024-05-17 (×29): qty 2

## 2024-05-17 MED ORDER — PROPOFOL 10 MG/ML IV BOLUS
INTRAVENOUS | Status: AC
  Filled 2024-05-17: qty 20

## 2024-05-17 MED ORDER — TORSEMIDE 10 MG PO TABS
5.0000 mg | ORAL_TABLET | Freq: Every day | ORAL | Status: AC
  Administered 2024-05-18 – 2024-05-24 (×7): 5 mg via ORAL
  Filled 2024-05-17 (×8): qty 0.5

## 2024-05-17 MED ORDER — PHENYLEPHRINE 80 MCG/ML (10ML) SYRINGE FOR IV PUSH (FOR BLOOD PRESSURE SUPPORT)
PREFILLED_SYRINGE | INTRAVENOUS | Status: AC
  Filled 2024-05-17: qty 20

## 2024-05-17 MED ORDER — PROPOFOL 10 MG/ML IV BOLUS
INTRAVENOUS | Status: DC | PRN
  Administered 2024-05-17: 20 mg via INTRAVENOUS
  Administered 2024-05-17: 100 mg via INTRAVENOUS

## 2024-05-17 MED ORDER — ROCURONIUM BROMIDE 100 MG/10ML IV SOLN
INTRAVENOUS | Status: DC | PRN
  Administered 2024-05-17: 20 mg via INTRAVENOUS
  Administered 2024-05-17: 40 mg via INTRAVENOUS

## 2024-05-17 MED ORDER — OXIDIZED CELLULOSE EX PADS
MEDICATED_PAD | CUTANEOUS | Status: DC | PRN
  Administered 2024-05-17: 1

## 2024-05-17 MED ORDER — INSULIN ASPART 100 UNIT/ML IJ SOLN
0.0000 [IU] | INTRAMUSCULAR | Status: DC
  Administered 2024-05-17 (×5): 3 [IU] via SUBCUTANEOUS
  Administered 2024-05-18 (×2): 2 [IU] via SUBCUTANEOUS
  Filled 2024-05-17 (×3): qty 3
  Filled 2024-05-17: qty 2
  Filled 2024-05-17 (×2): qty 3
  Filled 2024-05-17: qty 2

## 2024-05-17 MED ORDER — DEXAMETHASONE SOD PHOSPHATE PF 10 MG/ML IJ SOLN
INTRAMUSCULAR | Status: DC | PRN
  Administered 2024-05-17: 10 mg via INTRAVENOUS

## 2024-05-17 NOTE — Anesthesia Procedure Notes (Signed)
 Procedure Name: Intubation Date/Time: 05/17/2024 2:42 PM  Performed by: Gillermo Spruce I, CRNAPre-anesthesia Checklist: Patient identified, Emergency Drugs available, Suction available and Patient being monitored Patient Re-evaluated:Patient Re-evaluated prior to induction Oxygen Delivery Method: Circle system utilized Preoxygenation: Pre-oxygenation with 100% oxygen Induction Type: IV induction Ventilation: Mask ventilation without difficulty Laryngoscope Size: McGrath and 4 Grade View: Grade I Tube type: Oral Number of attempts: 1 Airway Equipment and Method: Stylet and Oral airway Placement Confirmation: ETT inserted through vocal cords under direct vision, positive ETCO2 and breath sounds checked- equal and bilateral Tube secured with: Tape Dental Injury: Teeth and Oropharynx as per pre-operative assessment

## 2024-05-17 NOTE — Interval H&P Note (Signed)
 History and Physical Interval Note:  05/17/2024 12:27 PM  Dominic Osborn  has presented today for surgery, with the diagnosis of re-bleed.  The various methods of treatment have been discussed with the patient and family. After consideration of risks, benefits and other options for treatment, the patient has consented to  Procedures: CRANIOTOMY HEMATOMA EVACUATION SUBDURAL (N/A) as a surgical intervention.  The patient's history has been reviewed, patient examined, no change in status, stable for surgery.  I have reviewed the patient's chart and labs.  Questions were answered to the patient's satisfaction.    Heart sounds normal no MRG. Chest Clear to Auscultation Bilaterally.  See note from earlier today for documentation of rationale.  Norabelle Kondo

## 2024-05-17 NOTE — Progress Notes (Signed)
"  Pt to OR at this time  "

## 2024-05-17 NOTE — Transfer of Care (Signed)
 Immediate Anesthesia Transfer of Care Note  Patient: Dominic Osborn  Procedure(s) Performed: CRANIOTOMY HEMATOMA EVACUATION SUBDURAL (Right: Head)  Patient Location: PACU  Anesthesia Type:General  Level of Consciousness: sedated and unresponsive  Airway & Oxygen Therapy: Patient Spontanous Breathing and Patient connected to face mask oxygen  Post-op Assessment: Report given to RN and Post -op Vital signs reviewed and stable  Post vital signs: Reviewed and stable  Last Vitals:  Vitals Value Taken Time  BP 114/68 05/17/24 17:45  Temp 36.3 C 05/17/24 17:41  Pulse 79 05/17/24 17:46  Resp 16 05/17/24 17:46  SpO2 100 % 05/17/24 17:46  Vitals shown include unfiled device data.  Last Pain:  Vitals:   05/17/24 1200  TempSrc: Oral  PainSc:          Complications: No notable events documented.

## 2024-05-17 NOTE — Consult Note (Signed)
 PHARMACY CONSULT NOTE - ELECTROLYTES  Pharmacy Consult for Electrolyte Monitoring and Replacement   Recent Labs: Potassium (mmol/L)  Date Value  05/17/2024 4.1   Magnesium (mg/dL)  Date Value  98/69/7973 2.2   Calcium (mg/dL)  Date Value  98/69/7973 9.0   Phosphorus (mg/dL)  Date Value  98/69/7973 4.0   Sodium (mmol/L)  Date Value  05/17/2024 140   Height: 6' 4 (193 cm) Weight: 102.8 kg (226 lb 10.1 oz) IBW/kg (Calculated) : 86.8 Estimated Creatinine Clearance: 56.7 mL/min (A) (by C-G formula based on SCr of 1.34 mg/dL (H)).  Assessment  Dominic Osborn is a 79 y.o. male presenting with subdural hematoma with midline shift s/p craniotomy . PMH significant for hypertension and atrial fibrillation on Eliquis. Pharmacy has been consulted to monitor and replace electrolytes.  Diet: NPO MIVF: N/A Pertinent medications: Torsemide  5 mg daily  Goal of Therapy: Electrolytes within normal limits  Plan:  No electrolyte replacement indicated at this time Follow-up electrolytes with AM labs tomorrow  Thank you for allowing pharmacy to be a part of this patients care.  Kimberl Vig B Blake Goya 05/17/2024 7:47 AM

## 2024-05-17 NOTE — Op Note (Signed)
 Indications: The patient is a 79yo male who presented with a subdural hematoma and underwent surgical evacuation last evening.  Over the day today, he had worsening headache.  A CT scan showed enlarging intracranial hemorrhage.  Due to worsening neurologic condition, return to operating room was recommended.   Findings: subdural and epidural hematoma  Preoperative Diagnosis: subdural hematoma Postoperative Diagnosis: epidural hematoma   EBL: 250 ml IVF: see AR Drains: one Disposition: Extubated and Stable to PACU Complications: none  Preoperative Note:   Risks of surgery discussed include: infection, bleeding, stroke, coma, death, paralysis, CSF leak, nerve/spinal cord injury, numbness, tingling, weakness, vascular injury, need for further surgery, persistent symptoms, and the risks of anesthesia. The patient and his family understood these risks and agreed to proceed.  NAME OF PROCEDURE:               1. Right Craniotomy for evacuation of hematoma   PROCEDURE:  Patient was brought to the operating room, intubated. The patient was then positioned for a right-sided craniotomy.  We utilized the horseshoe head holder.  The head wrap was removed.  The drain was removed and the Monocryl stitch tied.  The Telfa was then removed from the prior surgical dressing.  The prior surgical site was thoroughly cleansed.  The surgical site was prepped and then draped.  Timeout was performed.  Antibiotics were given.  The staples placed last evening were then removed.  The galeal stitches were divided.  The temporalis stitches were divided.  The myocutaneous flap was reflected and the craniotomy was exposed.  The inferior tack up suture was noted to be untied.  The other tack up was divided.  The bone flap was removed.  There was a large epidural collection.  This was evacuated carefully with irrigation and suction.  There was a new area of bleeding on the middle meningeal artery.  This was coagulated and  controlled.  The dura was then opened.  Upon opening the dura, there is a small amount of subdural hematoma.  This was removed.  No active bleeding was noted in the subdural space.  At this point, an epidural hematoma was diagnosed.  I carefully irrigated the operative site until it was evident that there was no active bleeding.  Hemostasis was achieved on the temporalis muscle.  The dura was then reapproximated.  A drain was placed.  The bone flap was then secured with both epidural tack up sutures and screws.    The temporalis and galea were closed. Nylon was used on the skin. A sterile dressing was placed.    Needle, lap and all counts were correct at the end of the case.    Dominic Goods PA assisted in the procedure. An assistant was required for this procedure due to the complexity.  The assistant provided assistance in tissue manipulation and suction, and was required for the successful and safe performance of the procedure. I performed the critical portions of the procedure.   Reeves Daisy MD Neurosurgery

## 2024-05-17 NOTE — Anesthesia Postprocedure Evaluation (Signed)
"   Anesthesia Post Note  Patient: Dominic Osborn  Procedure(s) Performed: CRANIOTOMY HEMATOMA EVACUATION SUBDURAL (Right)  Patient location during evaluation: ICU Anesthesia Type: General Level of consciousness: lethargic Pain management: pain level controlled Vital Signs Assessment: post-procedure vital signs reviewed and stable Respiratory status: spontaneous breathing Cardiovascular status: stable Anesthetic complications: no   No notable events documented.   Last Vitals:  Vitals:   05/17/24 0500 05/17/24 0600  BP: 123/74 118/67  Pulse: 77 76  Resp: 20 (!) 22  Temp:    SpO2: 98% 98%    Last Pain:  Vitals:   05/17/24 0400  TempSrc: Oral  PainSc: 0-No pain                 Elliotte Marsalis Dyane      "

## 2024-05-17 NOTE — Anesthesia Preprocedure Evaluation (Signed)
"                                    Anesthesia Evaluation    Airway        Dental   Pulmonary           Cardiovascular hypertension, + dysrhythmias Atrial Fibrillation   [facility] 05/16/24 Junctional rhy No new ST changes  S/p AVR for bicupsid valve  Echo 02/2024:  1. Left ventricular ejection fraction, by estimation, is 55 to 60% . The left ventricle has normal function. 2. Right ventricular systolic function is normal. The right ventricular size is normal. 3. Left atrial size was dilated. No left atrial/ left atrial appendage thrombus was detected. 4. The mitral valve is normal in structure. Mild mitral valve regurgitation. 5. Well seated bioprosthetic valve in aortic position with no dysfunction. Aortic valve not visualized   Neuro/Psych CT Head Impression:  1. Acute right cerebral convexity subdural hematoma measuring up to 1.8 cm with associated mass effect, effacement of the right lateral ventricle, and right to left midline shift measuring 1.2 cm.   Today CT  IMPRESSION: 1. Hyperattenuating right cerebral convexity subdural hematoma measures up to 1.7 cm (previously 1.6 cm) with similar mass effect and 1.0 cm leftward midline shift. 2. Small volume subarachnoid hemorrhage over the right frontal convexity, similar to prior. 3. Similar postoperative changes and pneumocephalus.     GI/Hepatic   Endo/Other    Renal/GU      Musculoskeletal   Abdominal   Peds  Hematology   Anesthesia Other Findings Anxiety  Bleeding disorder Hypertension  Dysrhythmia/Atrial fib Tubular adenoma  Thrombocytopenia PVC (premature ventricular contraction) Anemia Elevated PSA  History of atrial fibrillation   LABS 05/17/24 05/17/24 03:15 Basic metabolic panel with GFR: Rpt ! Sodium: 140 Potassium: 4.1 Chloride: 104 CO2: 24 Glucose: 180 (H) BUN: 33 (H) Creatinine: 1.34 (H) Calcium: 9.0 Anion gap: 12 Phosphorus: 4.0 Magnesium: 2.2 GFR, Estimated: 55  (L) WBC: 12.0 (H) RBC: 3.88 (L) Hemoglobin: 11.3 (L) HCT: 35.0 (L) MCV: 90.2 MCH: 29.1 MCHC: 32.3 RDW: 13.2 Platelets: 131 (L) nRBC: 0.0   !: Data is abnormal (H): Data is abnormally high (L): Data is abnormally low Rpt: View report in Results Review for more information   Reproductive/Obstetrics                              Anesthesia Physical Anesthesia Plan  ASA: 3 and emergent  Anesthesia Plan: General   Post-op Pain Management:    Induction: Intravenous  PONV Risk Score and Plan:   Airway Management Planned: Oral ETT  Additional Equipment:   Intra-op Plan:   Post-operative Plan: Extubation in OR  Informed Consent:      History available from chart only  Plan Discussed with:   Anesthesia Plan Comments:          Anesthesia Quick Evaluation  "

## 2024-05-17 NOTE — Plan of Care (Signed)
" °  Problem: Education: Goal: Knowledge of General Education information will improve Description: Including pain rating scale, medication(s)/side effects and non-pharmacologic comfort measures Outcome: Progressing   Problem: Health Behavior/Discharge Planning: Goal: Ability to manage health-related needs will improve Outcome: Progressing   Problem: Clinical Measurements: Goal: Ability to maintain clinical measurements within normal limits will improve Outcome: Progressing Goal: Will remain free from infection Outcome: Progressing Goal: Diagnostic test results will improve Outcome: Progressing Goal: Respiratory complications will improve Outcome: Progressing Goal: Cardiovascular complication will be avoided Outcome: Progressing   Problem: Activity: Goal: Risk for activity intolerance will decrease Outcome: Progressing   Problem: Nutrition: Goal: Adequate nutrition will be maintained Outcome: Progressing   Problem: Coping: Goal: Level of anxiety will decrease Outcome: Progressing   Problem: Elimination: Goal: Will not experience complications related to bowel motility Outcome: Progressing Goal: Will not experience complications related to urinary retention Outcome: Progressing   Problem: Pain Managment: Goal: General experience of comfort will improve and/or be controlled Outcome: Progressing   Problem: Safety: Goal: Ability to remain free from injury will improve Outcome: Progressing   Problem: Skin Integrity: Goal: Risk for impaired skin integrity will decrease Outcome: Progressing   Problem: Education: Goal: Knowledge of the prescribed therapeutic regimen will improve Outcome: Progressing   Problem: Bowel/Gastric: Goal: Gastrointestinal status for postoperative course will improve Outcome: Progressing   Problem: Cardiac: Goal: Ability to maintain an adequate cardiac output Outcome: Progressing Goal: Will show no evidence of cardiac arrhythmias Outcome:  Progressing   Problem: Nutritional: Goal: Will attain and maintain optimal nutritional status Outcome: Progressing   Problem: Neurological: Goal: Will regain or maintain usual level of consciousness Outcome: Progressing   Problem: Clinical Measurements: Goal: Ability to maintain clinical measurements within normal limits Outcome: Progressing Goal: Postoperative complications will be avoided or minimized Outcome: Progressing   Problem: Respiratory: Goal: Will regain and/or maintain adequate ventilation Outcome: Progressing Goal: Respiratory status will improve Outcome: Progressing   Problem: Skin Integrity: Goal: Demonstrates signs of wound healing without infection Outcome: Progressing   Problem: Urinary Elimination: Goal: Will remain free from infection Outcome: Progressing Goal: Ability to achieve and maintain adequate urine output Outcome: Progressing   Problem: Education: Goal: Ability to describe self-care measures that may prevent or decrease complications (Diabetes Survival Skills Education) will improve Outcome: Progressing Goal: Individualized Educational Video(s) Outcome: Progressing   Problem: Coping: Goal: Ability to adjust to condition or change in health will improve Outcome: Progressing   Problem: Fluid Volume: Goal: Ability to maintain a balanced intake and output will improve Outcome: Progressing   Problem: Health Behavior/Discharge Planning: Goal: Ability to identify and utilize available resources and services will improve Outcome: Progressing Goal: Ability to manage health-related needs will improve Outcome: Progressing   Problem: Metabolic: Goal: Ability to maintain appropriate glucose levels will improve Outcome: Progressing   Problem: Nutritional: Goal: Maintenance of adequate nutrition will improve Outcome: Progressing Goal: Progress toward achieving an optimal weight will improve Outcome: Progressing   Problem: Skin  Integrity: Goal: Risk for impaired skin integrity will decrease Outcome: Progressing   Problem: Tissue Perfusion: Goal: Adequacy of tissue perfusion will improve Outcome: Progressing   "

## 2024-05-17 NOTE — Progress Notes (Signed)
 eLink Physician-Brief Progress Note Patient Name: Dominic Osborn DOB: January 16, 1946 MRN: [id number]   Date of Service  05/17/2024  HPI/Events of Note  79 year old male with a history of paroxysmal atrial fibrillation on anticoagulation with Eliquis who slipped and fell on the ice and hit his head.  He did not lose consciousness.  He was brought into the ED by his daughter.  While in the ED, his mental state declined.  CT scan of his head showed a large subdural hematoma on the right with a right to left midline shift.  He was given Kcentra  for reversal of his Eliquis.  He was sent to the OR where he had a right craniotomy for hematoma evacuation.  He was extubated postoperatively and admitted to ICU.  Currently he complains of headache but otherwise has no other complaints.  eICU Interventions  Patient's chart reviewed.  Pertinent labs and imaging studies reviewed.  Video assessment of patient done.  Case discussed with RN. Impression: Acute traumatic subdural hematoma s/p evacuation Headache History of paroxysmal atrial fibrillation  Plan: Postop care per neurosurgery protocol. Start Tylenol  for headache relief Continue metoprolol  for rate control.  Avoid antiplatelets and anticoagulants. Case discussed with bedside RN.      Intervention Category Evaluation Type: New Patient Evaluation  Jerilynn Berg 05/17/2024, 1:50 AM

## 2024-05-17 NOTE — Progress Notes (Addendum)
" ° °  Neurosurgery Progress Note  History: Dominic Osborn is s/p right craniotomy for SDH  POD1: Pt complaining of a headache this morning   Physical Exam: Vitals:   05/17/24 0600 05/17/24 0700  BP: 118/67 126/74  Pulse: 76 78  Resp: (!) 22 20  Temp:    SpO2: 98% 94%    Drowsy but arouses easily to voice. Oriented x 3 CNI  Strength:5/5 throughout right side. 4/5 throughout LUE 5/5 throughout LLE.  Mild pronator drift on the left.  JP 265 since surgery.  Data:  Other tests/results:  05/17/24 FINDINGS:   BRAIN AND VENTRICLES: Interval postoperative changes of right craniotomy for subdural hematoma evacuation with decreased residual subdural hematoma measuring 1.6 cm thickness, previously 1.9 cm. Decreased leftward midline shift measuring 1.0 cm, previously 1.2 cm. Possible small subarachnoid hemorrhage along right frontal convexity. Pneumocephalus along anterior frontal convexities. No evidence of acute infarct. No hydrocephalus.   ORBITS: No acute abnormality.   SINUSES: Mucosal thickening in right maxillary sinus.   SOFT TISSUES AND SKULL: Scalp drain and skin staples in place. Postoperative right craniotomy changes. Gas in right temporalis region soft tissues related to recent surgery. No skull fracture.   IMPRESSION: 1. Interval postoperative changes of right craniotomy for subdural hematoma evacuation with decreased residual subdural hematoma (1.6 cm, previously 1.9 cm) and decreased leftward midline shift (1.0 cm, previously 1.2 cm). Scalp drain and skin staples in place. 2. Possible small subarachnoid hemorrhage along right frontal convexity. 3. Pneumocephalus along anterior frontal convexities, likely postoperative.   Electronically signed by: Dominic Coho Osborn 05/17/2024 06:02 AM EST RP Workstation: [id number]  Assessment/Plan:  Dominic Osborn is a 79 y.o s/p fall on ice resulting in right sided SDH with leftward shift. S/p craniotomy for evacuation.  Repeat CT head showing improves shift but small amount of re-accumulation  - will repeat CT head at noon - ordered 1 unit of platelets - continue q1 hr neuro checks - INR pending  - drain: Continue to monitor  - BP goals  - Keppra  500 mg BID x 7 days  Dominic Osborn Department of Neurosurgery    Addendum to above.  Patient began having increased headache around 1030, so went for repeat scan.  I examined patient after CT, where he reported headache but was otherwise in similar condition to above with mild drift but no overt deficits.  O.4, OE voice.  CT at 1025 today shows slighty increased hematoma with similar midline shift but worsened mass effect on the lateral ventricles.  Overall, I feel he is declining and showing signs of increased intracranial pressure and worsened brain compression from the mass effect of the hematoma.  I spoke with his wife, daughter, and brother.  I reviewed this with them and recommended return to OR with the same risks as discussed and documented yesterday evening.  They asked that I proceed as soon as an operating room is available.  We will administer mannitol  and make arrangements to return to OR. I will discuss further blood products with the ICU team.   Dominic Dominic Osborn  "

## 2024-05-18 ENCOUNTER — Inpatient Hospital Stay

## 2024-05-18 DIAGNOSIS — I480 Paroxysmal atrial fibrillation: Secondary | ICD-10-CM | POA: Diagnosis not present

## 2024-05-18 DIAGNOSIS — S065XAA Traumatic subdural hemorrhage with loss of consciousness status unknown, initial encounter: Secondary | ICD-10-CM | POA: Diagnosis not present

## 2024-05-18 LAB — BASIC METABOLIC PANEL WITH GFR
Anion gap: 10 (ref 5–15)
BUN: 38 mg/dL — ABNORMAL HIGH (ref 8–23)
CO2: 27 mmol/L (ref 22–32)
Calcium: 8.5 mg/dL — ABNORMAL LOW (ref 8.9–10.3)
Chloride: 106 mmol/L (ref 98–111)
Creatinine, Ser: 1.3 mg/dL — ABNORMAL HIGH (ref 0.61–1.24)
GFR, Estimated: 57 mL/min — ABNORMAL LOW
Glucose, Bld: 147 mg/dL — ABNORMAL HIGH (ref 70–99)
Potassium: 4.2 mmol/L (ref 3.5–5.1)
Sodium: 142 mmol/L (ref 135–145)

## 2024-05-18 LAB — GLUCOSE, CAPILLARY
Glucose-Capillary: 105 mg/dL — ABNORMAL HIGH (ref 70–99)
Glucose-Capillary: 120 mg/dL — ABNORMAL HIGH (ref 70–99)
Glucose-Capillary: 123 mg/dL — ABNORMAL HIGH (ref 70–99)
Glucose-Capillary: 123 mg/dL — ABNORMAL HIGH (ref 70–99)
Glucose-Capillary: 126 mg/dL — ABNORMAL HIGH (ref 70–99)
Glucose-Capillary: 128 mg/dL — ABNORMAL HIGH (ref 70–99)

## 2024-05-18 LAB — CBC
HCT: 25.8 % — ABNORMAL LOW (ref 39.0–52.0)
HCT: 28.3 % — ABNORMAL LOW (ref 39.0–52.0)
Hemoglobin: 8.5 g/dL — ABNORMAL LOW (ref 13.0–17.0)
Hemoglobin: 9.3 g/dL — ABNORMAL LOW (ref 13.0–17.0)
MCH: 29.9 pg (ref 26.0–34.0)
MCH: 30 pg (ref 26.0–34.0)
MCHC: 32.9 g/dL (ref 30.0–36.0)
MCHC: 32.9 g/dL (ref 30.0–36.0)
MCV: 90.8 fL (ref 80.0–100.0)
MCV: 91.3 fL (ref 80.0–100.0)
Platelets: 137 10*3/uL — ABNORMAL LOW (ref 150–400)
Platelets: 144 10*3/uL — ABNORMAL LOW (ref 150–400)
RBC: 2.84 MIL/uL — ABNORMAL LOW (ref 4.22–5.81)
RBC: 3.1 MIL/uL — ABNORMAL LOW (ref 4.22–5.81)
RDW: 13.5 % (ref 11.5–15.5)
RDW: 14 % (ref 11.5–15.5)
WBC: 17.3 10*3/uL — ABNORMAL HIGH (ref 4.0–10.5)
WBC: 19.2 10*3/uL — ABNORMAL HIGH (ref 4.0–10.5)
nRBC: 0 % (ref 0.0–0.2)
nRBC: 0 % (ref 0.0–0.2)

## 2024-05-18 LAB — PREPARE RBC (CROSSMATCH)

## 2024-05-18 LAB — ABO/RH: ABO/RH(D): O NEG

## 2024-05-18 LAB — HEMOGLOBIN AND HEMATOCRIT, BLOOD
HCT: 26.5 % — ABNORMAL LOW (ref 39.0–52.0)
Hemoglobin: 8.8 g/dL — ABNORMAL LOW (ref 13.0–17.0)

## 2024-05-18 LAB — MAGNESIUM: Magnesium: 2.5 mg/dL — ABNORMAL HIGH (ref 1.7–2.4)

## 2024-05-18 LAB — PHOSPHORUS: Phosphorus: 3.1 mg/dL (ref 2.5–4.6)

## 2024-05-18 MED ORDER — SODIUM CHLORIDE 0.9% IV SOLUTION: Freq: Once | INTRAVENOUS | Status: AC

## 2024-05-18 MED ORDER — INSULIN ASPART 100 UNIT/ML IJ SOLN: 0.0000 [IU] | Freq: Every day | INTRAMUSCULAR | Status: DC

## 2024-05-18 MED ORDER — OXYCODONE-ACETAMINOPHEN 5-325 MG PO TABS
1.0000 | ORAL_TABLET | Freq: Once | ORAL | Status: AC
  Administered 2024-05-18: 1 via ORAL
  Filled 2024-05-18: qty 1

## 2024-05-18 MED ORDER — INSULIN ASPART 100 UNIT/ML IJ SOLN
0.0000 [IU] | Freq: Three times a day (TID) | INTRAMUSCULAR | Status: DC
  Administered 2024-05-18: 2 [IU] via SUBCUTANEOUS
  Administered 2024-05-19: 3 [IU] via SUBCUTANEOUS
  Filled 2024-05-18: qty 2
  Filled 2024-05-18: qty 3

## 2024-05-18 NOTE — Anesthesia Postprocedure Evaluation (Signed)
"   Anesthesia Post Note  Patient: Dominic Osborn  Procedure(s) Performed: CRANIOTOMY HEMATOMA EVACUATION SUBDURAL (Right: Head)  Patient location during evaluation: ICU Anesthesia Type: General Level of consciousness: patient cooperative Pain management: pain level controlled Vital Signs Assessment: post-procedure vital signs reviewed and stable Respiratory status: spontaneous breathing, nonlabored ventilation, respiratory function stable and patient connected to nasal cannula oxygen Cardiovascular status: blood pressure returned to baseline and stable Postop Assessment: no apparent nausea or vomiting Anesthetic complications: no Comments: Sleeping. Appears comfortable.    No notable events documented.   Last Vitals:  Vitals:   05/18/24 0800 05/18/24 0824  BP: (!) 118/57 (!) 118/57  Pulse: 67 71  Resp: (!) 23   Temp: 36.8 C   SpO2: 97%     Last Pain:  Vitals:   05/18/24 0823  TempSrc:   PainSc: 0-No pain                 Shau-Shau Melia      "

## 2024-05-18 NOTE — Progress Notes (Signed)
 Inpatient Rehab Admissions Coordinator Note:   Per therapy patient was screened for CIR candidacy by Kegan Shepardson SHAUNNA Yvone Cohens, CCC-SLP. At this time, pt appears to be a potential candidate for CIR. If pt would like to be considered, please place an IP Rehab MD consult order.    Tinnie Yvone Cohens, MS, CCC-SLP Admissions Coordinator [phone] 05/18/24 6:58 PM

## 2024-05-18 NOTE — Progress Notes (Signed)
 Pt states feeling more drowsy this evening. Reports headache with 3/10 pain but appears to be in more pain than stated. Scheduled tylenol  administered. Reports more dizziness than prior. Pt does appear less perky than this morning and is forgetful at times. MD made aware and CT ordered/ completed.

## 2024-05-18 NOTE — TOC Initial Note (Signed)
 Transition of Care [facility]) - Initial/Assessment Note    Patient Details  Name: Dominic Osborn MRN: [id number] Date of Birth: 05/20/1945  [facility]) CM/SW Contact:    Dominic JINNY Ruts, LCSW Phone Number: 05/18/2024, 10:09 AM  Clinical Narrative:                 Chart reviewed. Please note ICM is working remotely due to the inclement weather. I was able to speak with the patient via phone call. I introduced myself, my role, and reason for phone call. The patient reports that Dominic Osborn is his PCP.   The patient reports that his wife Dominic Osborn lives in [place] , [place] and he sometime be with with her. The patient reports that he was able to complete task independently before admission to the hospital. The patient reports that he drives himself to medical appointments. The patient reports that his daughter Dominic Osborn will assist him during D/C. The patient reports that he has had HH in the past but does not remember the agency.   The patient reports that he has never been admitted into a SNF. The patient reports that he has a cane and walker in the home.  There are no current ICM needs.         Patient Goals and CMS Choice            Expected Discharge Plan and Services                                              Prior Living Arrangements/Services                       Activities of Daily Living   ADL Screening (condition at time of admission) Independently performs ADLs?: Yes (appropriate for developmental age) Is the patient deaf or have difficulty hearing?: No Does the patient have difficulty seeing, even when wearing glasses/contacts?: No Does the patient have difficulty concentrating, remembering, or making decisions?: No  Permission Sought/Granted                  Emotional Assessment              Admission diagnosis:  Subdural hematoma (HCC) [S06.5XAA] Contusion of face, initial encounter [S00.83XA] Patient Active Problem List   Diagnosis  Date Noted   Epidural hematoma (HCC) 05/17/2024   Subdural hematoma (HCC) 05/16/2024   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   Transient alteration of awareness 05/16/2024   History of atrial fibrillation 04/06/2018   Hx of ascending aorta repair 03/23/2018   S/P aortic valve replacement 03/23/2018   Ascending aortic aneurysm 09/25/2017   Essential (primary) hypertension 02/03/2014   Aortic valve, bicuspid 08/27/2013   Beat, premature ventricular 08/27/2013   PCP:  Osborn Dominic FALCON, MD Pharmacy:   [id number] Dominic Osborn, [place] - [address] [place] [address] Phone: [phone] Fax: [phone]  [facility] - [place], [place] - [address] Portal to Registered [facility] [address] [place] [address] Phone: [phone] Fax: [phone]     Social Drivers of Health (SDOH) Social History: SDOH Screenings   Food Insecurity: No Food Insecurity (05/17/2024)  Housing: Low Risk (05/17/2024)  Transportation Needs: No Transportation Needs (05/17/2024)  Utilities: Not At Risk (05/17/2024)  Financial Resource Strain:  Low Risk  (09/14/2023)   Received from [facility]  Social Connections: Socially Integrated (05/17/2024)  Tobacco Use: Low Risk (05/16/2024)   SDOH Interventions:     Readmission Risk Interventions     No data to display

## 2024-05-18 NOTE — Consult Note (Signed)
 PHARMACY CONSULT NOTE - ELECTROLYTES  Pharmacy Consult for Electrolyte Monitoring and Replacement   Recent Labs: Potassium (mmol/L)  Date Value  05/18/2024 4.2   Magnesium (mg/dL)  Date Value  98/68/7973 2.5 (H)   Calcium (mg/dL)  Date Value  98/68/7973 8.5 (L)   Phosphorus (mg/dL)  Date Value  98/68/7973 3.1   Sodium (mmol/L)  Date Value  05/18/2024 142   Height: 6' 4 (193 cm) Weight: 98.5 kg (217 lb 2.5 oz) IBW/kg (Calculated) : 86.8 Estimated Creatinine Clearance: 58.4 mL/min (A) (by C-G formula based on SCr of 1.3 mg/dL (H)).  Assessment  Dominic Osborn is a 79 y.o. male presenting with subdural hematoma with midline shift s/p craniotomy . PMH significant for hypertension and atrial fibrillation on Eliquis. Pharmacy has been consulted to monitor and replace electrolytes.  Pertinent medications: Torsemide  5 mg daily  Goal of Therapy: Electrolytes within normal limits  Plan:  No electrolyte replacement indicated at this time Follow-up electrolytes with AM labs tomorrow  Thank you for allowing pharmacy to be a part of this patients care.  Adriana JONETTA Bolster 05/18/2024 7:11 AM

## 2024-05-18 NOTE — Progress Notes (Addendum)
 "  NAME:  Dominic Osborn, MRN:  [id number], DOB:  September 24, 1945, LOS: 2 ADMISSION DATE:  05/16/2024, CONSULTATION DATE:  05/16/24 REFERRING MD:  Dr. Fernand, CHIEF COMPLAINT:  Fall  Brief Synopsis:  79 year old male with a past medical history significant for hypertension and atrial fibrillation on eliquis admitted for a subdural hematoma with midline shift s/p craniotomy.  History of Present Illness:  Dominic Osborn. Arata is a 79 year old male with a pmh of hypertension and atrial fibrillation on eliquis who presented to [facility] ED following a fall. It was reported that he fell between 3-4pm on 05/16/24. He was able to contact his daughter to pick him up and promptly brought him to the ED.   History gathered per chart review and evaluation at bedside It was reported that upon arrival to [facility] ED initially, he was able to ambulate independently and was conversing with family and hospital staff.   ED course: Upon arrival the patient reported a left-sided headache with wrist pain following his fall. He was taken for CT scan. While waiting for CT results, he mental status began to decline. Radiology contacted the EDP with concern for subdural hemorrhage. Per his daughter he is compliant with his medications and took eliquis the morning of his fall.  Vitals on Arrival: Temp: 98.49F BP: 143/89  HR: 70  RR: 18  SpO2: 97% on room air  Pertinent Labs/Diagnostics: Hyperglycemia with an AKI, mild anemia  I, Robet Kim, PA-C personally viewed and interpreted this ECG. EKG Interpretation: Date: 1/29, EKG Time: 1811, Rate: 70, Rhythm: sinus rhythm, QRS Axis:  normal Intervals: normal, ST/T Wave abnormalities: no acute signs of ischemia, Narrative Interpretation: normal ekg  Chemistry:  Na+:143 K+: 3.9 BUN/Cr.: 35/1.53 Serum CO2/ AG: 24/13 Glucose: 133  Hematology:  WBC: 10.7 Hgb: 12.9 Platelets 156 INR: 1.3  ID: MRSA PCR: negative  Xray of Left Wrist Impression:  1. Degenerative change without acute  abnormality.   CT C-Spine Impression: 1. Degenerative change without acute abnormality.   CT Head Impression:  1. Acute right cerebral convexity subdural hematoma measuring up to 1.8 cm with associated mass effect, effacement of the right lateral ventricle, and right to left midline shift measuring 1.2 cm.   PCCM consulted for admission due to subdural hematoma requiring emergent craniotomy and frequent neuro checks.   Pertinent Medical History   Past Medical History:  Diagnosis Date   Anemia    Anxiety    Bleeding disorder    Dysrhythmia/Atrial fib    Elevated PSA    History of atrial fibrillation    Hypertension    PVC (premature ventricular contraction)    Thrombocytopenia    Tubular adenoma      Significant Hospital Events: Including procedures, antibiotic start and stop dates in addition to other pertinent events   1/29: Admitted following a fall, found to have a subdural hematoma with midline shift requiring surgical intervention. PCCM consulted for admission. 1/30: Pt with c/o worsening headaches and neurologic condition worsened CT Head revealed subdural hematoma pt taken back to the OR for evacuation of hematoma  1/31: No acute events overnight vital signs stable.  PT/OT consulted   Interim History / Subjective:  As outlined above under significant events   Objective    Blood pressure (!) 118/57, pulse 71, temperature 98.2 F (36.8 C), temperature source Oral, resp. rate (!) 23, height 6' 4 (1.93 m), weight 98.5 kg, SpO2 97%.        Intake/Output Summary (Last 24  hours) at 05/18/2024 0920 Last data filed at 05/18/2024 0800 Gross per 24 hour  Intake 1510 ml  Output 1870 ml  Net -360 ml   Filed Weights   05/16/24 1623 05/17/24 0118 05/18/24 0500  Weight: 99.8 kg 102.8 kg 98.5 kg    Examination: General: Acutely-ill male resting in bed s/p right craniotomy, NAD  HENT: Neck supple, no JVD CV: NSR, s1s2, no r/m/g, 2+ radial/2+ distal pulses, no edema   Pulm: Clear throughout, even, non labored GI: +BS x4, soft, non distended, non tender  Extremities: Warm/dry, no edema noted Neuro: A&O x 4, follows commands, no focal neuro deficits, PERRL   Skin: Bandage on head dry/intact, right JP drain present on scalp with sanguinous output  GU: Indwelling foley catheter draining yellow urine    Resolved problem list   Assessment and Plan   #Subdural Hematoma S/P right craniotomy x2 - Pt underwent right craniotomy for evacuation on 01/29 and due to worsening headaches on 01/30 had to be taken back to the OR for surgical evacuation of subdural and epidural hematoma  - HOB >= 30 degrees with aspiration precautions - Stat head CT and call Neurosurgery if acute change - Repeat CT Head pending  - Monitor JP drain output  - Seizure precautions - Keppra  500mg  BID x7 days per neurosurgery recs  - SCD's for VTE px, if stable can start chemical VTE px 02/02 - Continue bowel regimen  - Scheduled tylenol  for pain management  - PT/OT - Maintain strict euglycemia, euthermia, and euvolemia   #Hypertension #Atrial Fibrillation on Eliquis - Continuous cardiac monitoring - Continue scheduled metoprolol  and irbesartan  for bp management  - Hold outpatient apixaban  - EKG prn  #Acute kidney injury suspect secondary to ATN  - Trend BMP - Replace electrolytes as indicates  - Strict I&O's - Avoid nephrotoxic agents as able   #Acute blood loss anemia  #Chronic thrombocytopenia  - Trend CBC  - Monitor for s/sx of bleeding  - Pts hgb decreased from 11.3 to 8.5 postop will transfuse 1 unit of pRBC's   #Hyperglycemia - ICU hypo/hyperglycemia protocol - CBG ac/hs  - SSI: Novolog  as indicated - Goal Range: 140-180  Pt updated regarding plan of care and all questions answered  Labs   CBC: Recent Labs  Lab 05/16/24 1810 05/17/24 0315 05/18/24 0356  WBC 10.7* 12.0* 17.3*  HGB 12.9* 11.3* 8.5*  HCT 38.6* 35.0* 25.8*  MCV 88.7 90.2 90.8  PLT 156  131* 137*    Basic Metabolic Panel: Recent Labs  Lab 05/16/24 1810 05/17/24 0315 05/18/24 0356  NA 143 140 142  K 3.9 4.1 4.2  CL 106 104 106  CO2 24 24 27   GLUCOSE 133* 180* 147*  BUN 35* 33* 38*  CREATININE 1.53* 1.34* 1.30*  CALCIUM 9.4 9.0 8.5*  MG  --  2.2 2.5*  PHOS  --  4.0 3.1   GFR: Estimated Creatinine Clearance: 58.4 mL/min (A) (by C-G formula based on SCr of 1.3 mg/dL (H)). Recent Labs  Lab 05/16/24 1810 05/17/24 0315 05/18/24 0356  WBC 10.7* 12.0* 17.3*    Liver Function Tests: No results for input(s): AST, ALT, ALKPHOS, BILITOT, PROT, ALBUMIN in the last 168 hours. No results for input(s): LIPASE, AMYLASE in the last 168 hours. No results for input(s): AMMONIA in the last 168 hours.  ABG No results found for: PHART, PCO2ART, PO2ART, HCO3, TCO2, ACIDBASEDEF, O2SAT   Coagulation Profile: Recent Labs  Lab 05/16/24 1810 05/17/24 0759  INR  1.3* 1.1    Cardiac Enzymes: No results for input(s): CKTOTAL, CKMB, CKMBINDEX, TROPONINI in the last 168 hours.  HbA1C: Hgb A1c MFr Bld  Date/Time Value Ref Range Status  05/16/2024 06:10 PM 5.6 4.8 - 5.6 % Final    Comment:    (NOTE) Diagnosis of Diabetes The following HbA1c ranges recommended by the American Diabetes Association (ADA) may be used as an aid in the diagnosis of diabetes mellitus.  Hemoglobin             Suggested A1C NGSP%              Diagnosis  <5.7                   Non Diabetic  5.7-6.4                Pre-Diabetic  >6.4                   Diabetic  <7.0                   Glycemic control for                       adults with diabetes.      CBG: Recent Labs  Lab 05/17/24 1840 05/17/24 1910 05/17/24 2305 05/18/24 0314 05/18/24 0729  GLUCAP 181* 166* 157* 128* 123*    Review of Systems: Positives in BOLD   Gen: Denies fever, chills, weight change, fatigue, night sweats HEENT: Denies blurred vision, double vision, hearing loss,  tinnitus, sinus congestion, rhinorrhea, sore throat, neck stiffness, dysphagia PULM: Denies shortness of breath, cough, sputum production, hemoptysis, wheezing CV: Denies chest pain, edema, orthopnea, paroxysmal nocturnal dyspnea, palpitations GI: Denies abdominal pain, nausea, vomiting, diarrhea, hematochezia, melena, constipation, change in bowel habits GU: Denies dysuria, hematuria, polyuria, oliguria, urethral discharge Endocrine: Denies hot or cold intolerance, polyuria, polyphagia or appetite change Derm: Denies rash, dry skin, scaling or peeling skin change Heme: Denies easy bruising, bleeding, bleeding gums Neuro: Denies headache, numbness, weakness, slurred speech, loss of memory or consciousness  Past Medical History:  He,  has a past medical history of Anemia, Anxiety, Bleeding disorder, Dysrhythmia/Atrial fib, Elevated PSA, History of atrial fibrillation, Hypertension, PVC (premature ventricular contraction), Thrombocytopenia, and Tubular adenoma.   Surgical History:   Past Surgical History:  Procedure Laterality Date   AORTA ANEURYSM REPAIR     CARDIAC VALVE REPLACEMENT     CARDIOVERSION N/A 02/22/2024   Procedure: CARDIOVERSION;  Surgeon: Wilburn Keller BROCKS, MD;  Location: [facility] ORS;  Service: Cardiovascular;  Laterality: N/A;   COLONOSCOPY N/A 05/01/2024   Procedure: COLONOSCOPY;  Surgeon: Toledo, Ladell POUR, MD;  Location: [facility] ENDOSCOPY;  Service: Gastroenterology;  Laterality: N/A;   COLONOSCOPY WITH PROPOFOL  N/A 10/01/2018   Procedure: COLONOSCOPY WITH PROPOFOL ;  Surgeon: Viktoria Lamar DASEN, MD;  Location: [facility] ENDOSCOPY;  Service: Endoscopy;  Laterality: N/A;   CRANIOTOMY Right 05/16/2024   Procedure: CRANIOTOMY HEMATOMA EVACUATION SUBDURAL;  Surgeon: Clois Fret, MD;  Location: [facility] ORS;  Service: Neurosurgery;  Laterality: Right;   ESOPHAGEAL DILATION  11/23/2022   Procedure: ESOPHAGEAL DILATION;  Surgeon: Toledo, Teodoro K, MD;  Location: [facility] ENDOSCOPY;  Service:  Gastroenterology;;   ESOPHAGOGASTRODUODENOSCOPY (EGD) WITH PROPOFOL  N/A 11/23/2022   Procedure: ESOPHAGOGASTRODUODENOSCOPY (EGD) WITH PROPOFOL ;  Surgeon: Toledo, Ladell POUR, MD;  Location: [facility] ENDOSCOPY;  Service: Gastroenterology;  Laterality: N/A;   TEE WITHOUT CARDIOVERSION N/A 02/22/2024   Procedure: ECHOCARDIOGRAM, TRANSESOPHAGEAL;  Surgeon:  Alluri, Keller BROCKS, MD;  Location: [facility] ORS;  Service: Cardiovascular;  Laterality: N/A;   TONSILLECTOMY       Social History:   reports that he has never smoked. He has never used smokeless tobacco. He reports that he does not currently use alcohol. He reports that he does not use drugs.   Family History:  His family history is negative for Bladder Cancer, Prostate cancer, and Kidney cancer.   Allergies Allergies[1]   Home Medications  Prior to Admission medications  Medication Sig Start Date End Date Taking? Authorizing Provider  acetaminophen  (TYLENOL ) 650 MG CR tablet Take 650 mg by mouth every 8 (eight) hours as needed for pain.    [provider]  amLODipine (NORVASC) 10 MG tablet Take 10 mg by mouth at bedtime. 02/20/24   [provider]  apixaban (ELIQUIS) 5 MG TABS tablet Take 5 mg by mouth 2 (two) times daily. 02/08/24   [provider]  aspirin EC 81 MG tablet Take 81 mg by mouth daily. Swallow whole.    [provider]  calcium citrate (CALCITRATE - DOSED IN MG ELEMENTAL CALCIUM) 950 (200 Ca) MG tablet Take 200 mg of elemental calcium by mouth daily.    [provider]  calcium-vitamin D (OSCAL WITH D) 500-200 MG-UNIT tablet Take 1 tablet by mouth daily at 12 noon.    [provider]  diphenhydrAMINE (BENADRYL) 50 MG capsule Take 50 mg by mouth daily at 12 noon.    [provider]  Magnesium Oxide 500 MG CAPS Take 1 capsule by mouth daily at 12 noon.    [provider]  metoprolol  succinate (TOPROL -XL) 50 MG 24 hr tablet Take 50 mg by mouth See admin instructions. 50mg   in the morning and 25mg  at night    [provider]  Multiple Vitamin (MULTI-VITAMINS) TABS Take 1 tablet by mouth daily at 12 noon.    [provider]  pantoprazole (PROTONIX) 40 MG tablet Take 40 mg by mouth 2 (two) times daily.    [provider]  POTASSIUM GLUCONATE PO Take 1 tablet by mouth daily at 12 noon.    [provider]  tadalafil  (CIALIS ) 20 MG tablet Take 0.5 tablets (10 mg total) by mouth daily as needed for erectile dysfunction. 03/08/23 03/08/24  Stoioff, Glendia BROCKS, MD  telmisartan (MICARDIS) 80 MG tablet Take 40 mg by mouth daily.    [provider]  torsemide  (DEMADEX ) 10 MG tablet Take 5 mg by mouth daily at 12 noon. 02/15/24   [provider]  VITAMIN D PO Take 1 tablet by mouth daily at 12 noon.    [provider]     Critical care time: 35 minutes     Lonell Moose, AGNP  Pulmonary/Critical Care Pager [phone] (please enter 7 digits) PCCM Consult Pager [phone] (please enter 7 digits)           [1]  Allergies Allergen Reactions   Quinolones     Other reaction(s): Other (See Comments) Fluroquinolone antibiotics should be avoided in patients with history of aortic aneurysm/dissection    "

## 2024-05-18 NOTE — Progress Notes (Signed)
 Pt has been more weak on left side than right but this is not a new change. A&OX4. Old drainage to surgical site. JP drain removed by MD.   Pt complains of mild dizziness and frontal headache of 1/10 after sitting on edge of bed approximately 0845. MD made aware, Will continue to monitor.

## 2024-05-18 NOTE — Progress Notes (Addendum)
" ° °  Neurosurgery Progress Note  History: Dominic Osborn is s/p right craniotomy for SDH  POD1/2: Doing much better.  Minor headache. POD1: Pt complaining of a headache this morning   Physical Exam: Vitals:   05/18/24 0800 05/18/24 0824  BP: (!) 118/57 (!) 118/57  Pulse: 67 71  Resp: (!) 23   Temp: 98.2 F (36.8 C)   SpO2: 97%    OE spontaneously, interactive, Oriented x 3 CNI  Strength:5/5 throughout  No pronator drift  JP removed  Data:  Other tests/results:  05/17/24 FINDINGS:   BRAIN AND VENTRICLES: Interval postoperative changes of right craniotomy for subdural hematoma evacuation with decreased residual subdural hematoma measuring 1.6 cm thickness, previously 1.9 cm. Decreased leftward midline shift measuring 1.0 cm, previously 1.2 cm. Possible small subarachnoid hemorrhage along right frontal convexity. Pneumocephalus along anterior frontal convexities. No evidence of acute infarct. No hydrocephalus.   ORBITS: No acute abnormality.   SINUSES: Mucosal thickening in right maxillary sinus.   SOFT TISSUES AND SKULL: Scalp drain and skin staples in place. Postoperative right craniotomy changes. Gas in right temporalis region soft tissues related to recent surgery. No skull fracture.   IMPRESSION: 1. Interval postoperative changes of right craniotomy for subdural hematoma evacuation with decreased residual subdural hematoma (1.6 cm, previously 1.9 cm) and decreased leftward midline shift (1.0 cm, previously 1.2 cm). Scalp drain and skin staples in place. 2. Possible small subarachnoid hemorrhage along right frontal convexity. 3. Pneumocephalus along anterior frontal convexities, likely postoperative.   Electronically signed by: Evalene Coho MD 05/17/2024 06:02 AM EST RP Workstation: [id number]  CT 05/18/24 Near resolution of hemorrhage from 1/30. MLS improved  Labs reviewed - monitor H and H  Assessment/Plan:  Dominic Osborn is a 79 y.o s/p fall on  ice resulting in right sided SDH with leftward shift. S/p craniotomy for evacuation and takeback for post-operative epidural hematoma  - q2 hr neuro checks - drain: removed - BP goals SBP<160 - Keppra  500 mg BID x 7 days - If stable start DVT PPX tomorrow - OK to mobilize today with PTOT if tolerated  Reeves Daisy MD Department of Neurosurgery   "

## 2024-05-19 DIAGNOSIS — S065XAA Traumatic subdural hemorrhage with loss of consciousness status unknown, initial encounter: Secondary | ICD-10-CM | POA: Diagnosis not present

## 2024-05-19 DIAGNOSIS — I480 Paroxysmal atrial fibrillation: Secondary | ICD-10-CM | POA: Diagnosis not present

## 2024-05-19 LAB — BASIC METABOLIC PANEL WITH GFR
Anion gap: 7 (ref 5–15)
BUN: 39 mg/dL — ABNORMAL HIGH (ref 8–23)
CO2: 28 mmol/L (ref 22–32)
Calcium: 8.3 mg/dL — ABNORMAL LOW (ref 8.9–10.3)
Chloride: 107 mmol/L (ref 98–111)
Creatinine, Ser: 1.1 mg/dL (ref 0.61–1.24)
GFR, Estimated: >60 mL/min
Glucose, Bld: 125 mg/dL — ABNORMAL HIGH (ref 70–99)
Potassium: 3.7 mmol/L (ref 3.5–5.1)
Sodium: 141 mmol/L (ref 135–145)

## 2024-05-19 LAB — CBC
HCT: 25.1 % — ABNORMAL LOW (ref 39.0–52.0)
Hemoglobin: 8.5 g/dL — ABNORMAL LOW (ref 13.0–17.0)
MCH: 30.1 pg (ref 26.0–34.0)
MCHC: 33.9 g/dL (ref 30.0–36.0)
MCV: 89 fL (ref 80.0–100.0)
Platelets: 119 10*3/uL — ABNORMAL LOW (ref 150–400)
RBC: 2.82 MIL/uL — ABNORMAL LOW (ref 4.22–5.81)
RDW: 14.1 % (ref 11.5–15.5)
WBC: 13.1 10*3/uL — ABNORMAL HIGH (ref 4.0–10.5)
nRBC: 0 % (ref 0.0–0.2)

## 2024-05-19 LAB — BPAM RBC
Blood Product Expiration Date: 202602162359
ISSUE DATE / TIME: 202601311153
Unit Type and Rh: 9500

## 2024-05-19 LAB — MAGNESIUM: Magnesium: 2.4 mg/dL (ref 1.7–2.4)

## 2024-05-19 LAB — TYPE AND SCREEN
ABO/RH(D): O NEG
Antibody Screen: NEGATIVE
Unit division: 0

## 2024-05-19 LAB — BPAM PLATELET PHERESIS
Blood Product Expiration Date: 202602022359
ISSUE DATE / TIME: 202601300830
Unit Type and Rh: 6200

## 2024-05-19 LAB — GLUCOSE, CAPILLARY
Glucose-Capillary: 152 mg/dL — ABNORMAL HIGH (ref 70–99)
Glucose-Capillary: 99 mg/dL (ref 70–99)
Glucose-Capillary: 111 mg/dL — ABNORMAL HIGH (ref 70–99)
Glucose-Capillary: 112 mg/dL — ABNORMAL HIGH (ref 70–99)

## 2024-05-19 LAB — PREPARE PLATELET PHERESIS: Unit division: 0

## 2024-05-19 LAB — PHOSPHORUS: Phosphorus: 2.1 mg/dL — ABNORMAL LOW (ref 2.5–4.6)

## 2024-05-19 MED ORDER — ENOXAPARIN SODIUM 40 MG/0.4ML IJ SOSY
40.0000 mg | PREFILLED_SYRINGE | INTRAMUSCULAR | Status: AC
  Administered 2024-05-19 – 2024-05-24 (×6): 40 mg via SUBCUTANEOUS
  Filled 2024-05-19 (×6): qty 0.4

## 2024-05-19 MED ORDER — K PHOS MONO-SOD PHOS DI & MONO 155-852-130 MG PO TABS
500.0000 mg | ORAL_TABLET | Freq: Once | ORAL | Status: AC
  Administered 2024-05-19: 500 mg via ORAL
  Filled 2024-05-19: qty 2

## 2024-05-19 MED ORDER — OXYCODONE HCL 5 MG PO TABS
5.0000 mg | ORAL_TABLET | Freq: Four times a day (QID) | ORAL | Status: DC | PRN
  Administered 2024-05-19 – 2024-05-23 (×13): 5 mg via ORAL
  Filled 2024-05-19 (×13): qty 1

## 2024-05-19 MED ORDER — LEVETIRACETAM 500 MG PO TABS
500.0000 mg | ORAL_TABLET | Freq: Two times a day (BID) | ORAL | Status: AC
  Administered 2024-05-19 – 2024-05-22 (×8): 500 mg via ORAL
  Filled 2024-05-19 (×8): qty 1

## 2024-05-19 MED ORDER — ONDANSETRON HCL 4 MG/2ML IJ SOLN
4.0000 mg | Freq: Four times a day (QID) | INTRAMUSCULAR | Status: AC | PRN
  Filled 2024-05-19: qty 2

## 2024-05-19 NOTE — Consult Note (Signed)
 PHARMACY CONSULT NOTE - ELECTROLYTES  Pharmacy Consult for Electrolyte Monitoring and Replacement   Recent Labs: Potassium (mmol/L)  Date Value  05/19/2024 3.7   Magnesium (mg/dL)  Date Value  97/98/7973 2.4   Calcium (mg/dL)  Date Value  97/98/7973 8.3 (L)   Phosphorus (mg/dL)  Date Value  97/98/7973 2.1 (L)   Sodium (mmol/L)  Date Value  05/19/2024 141   Height: 6' 4 (193 cm) Weight: 98.2 kg (216 lb 7.9 oz) IBW/kg (Calculated) : 86.8 Estimated Creatinine Clearance: 69 mL/min (by C-G formula based on SCr of 1.1 mg/dL).  Assessment  Dominic Osborn is a 79 y.o. male presenting with subdural hematoma with midline shift s/p craniotomy . PMH significant for hypertension and atrial fibrillation on Eliquis. Pharmacy has been consulted to monitor and replace electrolytes.  Pertinent medications: torsemide  5 mg po daily  Goal of Therapy: Electrolytes within normal limits  Plan:  500 mg po phosphorous x 1 Follow-up electrolytes with AM labs tomorrow  Thank you for allowing pharmacy to be a part of this patients care.  Adriana JONETTA Bolster 05/19/2024 7:20 AM

## 2024-05-19 NOTE — Progress Notes (Signed)
" ° °  Neurosurgery Progress Note  History: Dominic Osborn is s/p right craniotomy for SDH  POD2/3: Doing well. Needs BM POD1/2: Doing much better.  Minor headache. POD1: Pt complaining of a headache this morning   Physical Exam: Vitals:   05/19/24 0700 05/19/24 0800  BP: (!) 107/53 132/72  Pulse: 62 70  Resp:  12  Temp:  98.2 F (36.8 C)  SpO2: 97% 97%   OE spontaneously, interactive, Oriented x 3 CNI  Strength:5/5 throughout  No pronator drift  Dressing removed - small area of bleeding.  Dressed.  Data:  Other tests/results:  05/17/24 FINDINGS:   BRAIN AND VENTRICLES: Interval postoperative changes of right craniotomy for subdural hematoma evacuation with decreased residual subdural hematoma measuring 1.6 cm thickness, previously 1.9 cm. Decreased leftward midline shift measuring 1.0 cm, previously 1.2 cm. Possible small subarachnoid hemorrhage along right frontal convexity. Pneumocephalus along anterior frontal convexities. No evidence of acute infarct. No hydrocephalus.   ORBITS: No acute abnormality.   SINUSES: Mucosal thickening in right maxillary sinus.   SOFT TISSUES AND SKULL: Scalp drain and skin staples in place. Postoperative right craniotomy changes. Gas in right temporalis region soft tissues related to recent surgery. No skull fracture.   IMPRESSION: 1. Interval postoperative changes of right craniotomy for subdural hematoma evacuation with decreased residual subdural hematoma (1.6 cm, previously 1.9 cm) and decreased leftward midline shift (1.0 cm, previously 1.2 cm). Scalp drain and skin staples in place. 2. Possible small subarachnoid hemorrhage along right frontal convexity. 3. Pneumocephalus along anterior frontal convexities, likely postoperative.   Electronically signed by: Evalene Coho MD 05/17/2024 06:02 AM EST RP Workstation: [id number]  CT 05/18/24 Near resolution of hemorrhage from 1/30. MLS improved  2nd CT 1/31 - stable to  slightly improved.  Labs reviewed - monitor H and H  Assessment/Plan:  Dominic Osborn is a 79 y.o s/p fall on ice resulting in right sided SDH with leftward shift. S/p craniotomy for evacuation and takeback for post-operative epidural hematoma  - q4 hr neuro checks, ok for stepdown - drain: removed - BP goals SBP<160 - Keppra  500 mg BID x 7 days - DVT PPX today - OK to mobilize today with PTOT if tolerated  Reeves Daisy MD Department of Neurosurgery   "

## 2024-05-19 NOTE — Plan of Care (Signed)
  Problem: Activity: Goal: Risk for activity intolerance will decrease Outcome: Progressing   Problem: Safety: Goal: Ability to remain free from injury will improve Outcome: Progressing   

## 2024-05-20 ENCOUNTER — Encounter: Payer: Self-pay | Admitting: Neurosurgery

## 2024-05-20 LAB — BASIC METABOLIC PANEL WITH GFR
Anion gap: 8 (ref 5–15)
BUN: 24 mg/dL — ABNORMAL HIGH (ref 8–23)
CO2: 25 mmol/L (ref 22–32)
Calcium: 8.4 mg/dL — ABNORMAL LOW (ref 8.9–10.3)
Chloride: 109 mmol/L (ref 98–111)
Creatinine, Ser: 0.95 mg/dL (ref 0.61–1.24)
GFR, Estimated: >60 mL/min
Glucose, Bld: 110 mg/dL — ABNORMAL HIGH (ref 70–99)
Potassium: 3.7 mmol/L (ref 3.5–5.1)
Sodium: 141 mmol/L (ref 135–145)

## 2024-05-20 LAB — CBC
HCT: 25.5 % — ABNORMAL LOW (ref 39.0–52.0)
Hemoglobin: 8.6 g/dL — ABNORMAL LOW (ref 13.0–17.0)
MCH: 30 pg (ref 26.0–34.0)
MCHC: 33.7 g/dL (ref 30.0–36.0)
MCV: 88.9 fL (ref 80.0–100.0)
Platelets: 124 10*3/uL — ABNORMAL LOW (ref 150–400)
RBC: 2.87 MIL/uL — ABNORMAL LOW (ref 4.22–5.81)
RDW: 13.2 % (ref 11.5–15.5)
WBC: 8.4 10*3/uL (ref 4.0–10.5)
nRBC: 0 % (ref 0.0–0.2)

## 2024-05-20 LAB — PHOSPHORUS: Phosphorus: 2.3 mg/dL — ABNORMAL LOW (ref 2.5–4.6)

## 2024-05-20 LAB — GLUCOSE, CAPILLARY
Glucose-Capillary: 110 mg/dL — ABNORMAL HIGH (ref 70–99)
Glucose-Capillary: 109 mg/dL — ABNORMAL HIGH (ref 70–99)

## 2024-05-20 LAB — MAGNESIUM: Magnesium: 2.3 mg/dL (ref 1.7–2.4)

## 2024-05-20 MED ORDER — IRBESARTAN 150 MG PO TABS
300.0000 mg | ORAL_TABLET | Freq: Every day | ORAL | Status: AC
  Administered 2024-05-21 – 2024-05-24 (×4): 300 mg via ORAL
  Filled 2024-05-20 (×4): qty 2

## 2024-05-20 MED ORDER — TRAZODONE HCL 50 MG PO TABS
50.0000 mg | ORAL_TABLET | Freq: Every evening | ORAL | Status: AC | PRN
  Administered 2024-05-20 – 2024-05-23 (×4): 50 mg via ORAL
  Filled 2024-05-20 (×5): qty 1

## 2024-05-20 NOTE — TOC Progression Note (Signed)
 Transition of Care [facility]) - Progression Note    Patient Details  Name: Dominic Osborn MRN: [id number] Date of Birth: 12-19-1945  Transition of [facility]) CM/SW Contact  Dominic JONETTA Hamilton, RN Phone Number: 05/20/2024, 9:24 AM  Clinical Narrative:     Spoke with patient, introduced self and explained role. Discussed with patient therapy recommendation for inpatient rehab. Patient verbalized understanding and agreement to this. Patient states his wife went to CIR and had a miraculous result. Patient also requested this CM reach out to his daughter to discuss as well.   Spoke with patient's daughter Dominic Osborn, discussed above, Dominic Osborn verbalized agreement as well.   Scio sent to Attending and IP Admissions for IP Consult, this has been placed.                     Expected Discharge Plan and Services                                               Social Drivers of Health (SDOH) Interventions SDOH Screenings   Food Insecurity: No Food Insecurity (05/17/2024)  Housing: Low Risk (05/17/2024)  Transportation Needs: No Transportation Needs (05/17/2024)  Utilities: Not At Risk (05/17/2024)  Financial Resource Strain: Low Risk  (09/14/2023)   Received from [facility]  Social Connections: Socially Integrated (05/17/2024)  Tobacco Use: Low Risk (05/16/2024)    Readmission Risk Interventions     No data to display

## 2024-05-20 NOTE — Progress Notes (Signed)
 " Dominic Osborn  FMW:[id number] DOB: Sep 23, 1945 DOA: 05/16/2024 PCP: Cleotilde Oneil FALCON, MD     Brief Narrative:   Dominic Osborn is a 79 year old male with a pmh of hypertension and atrial fibrillation on eliquis who presented to [facility] ED following a fall. It was reported that he fell between 3-4pm on 05/16/24. He was able to contact his daughter to pick him up and promptly brought him to the ED.    History gathered per chart review and evaluation at bedside It was reported that upon arrival to [facility] ED initially, he was able to ambulate independently and was conversing with family and hospital staff.    ED course: Upon arrival the patient reported a left-sided headache with wrist pain following his fall. He was taken for CT scan. While waiting for CT results, he mental status began to decline. Radiology contacted the EDP with concern for subdural hemorrhage. Per his daughter he is compliant with his medications and took eliquis the morning of his fall.  1/29: Admitted following a fall, found to have a subdural hematoma with midline shift requiring surgical intervention. PCCM consulted for admission. 1/30: Pt with c/o worsening headaches and neurologic condition worsened CT Head revealed subdural hematoma pt taken back to the OR for evacuation of hematoma  1/31: No acute events overnight vital signs stable.  PT/OT consulted   Assessment & Plan:   Principal Problem:   Subdural hematoma (HCC) Active Problems:   Essential (primary) hypertension   History of atrial fibrillation   S/P aortic valve replacement   Compression of brain (HCC)   Transient alteration of awareness   Epidural hematoma (HCC)  # Traumatic subdural hematoma # Epidural Fell on ice. On apixaban (reversed with k-centra). Evacuated by neurosurgery on 1/29, taken back to OR on 1/30 for epidural bleeding that was evacuated. Now stable - PT advising CIR, referral placed - neurosurgery following - continue  keppra  through 2/5 - pain control, bowel regimen  # Acute blood loss anemia Hgb has settled in the 8s. Combined EBL 500 ml - trend  # A-fib S/p cardioversion last year - neurosurgery advises holding anticoagulation 4 weeks, family says they are inclined to hold until they follow up with cardiology - cont home metop  # HTN Bp has been labile, moderate elevation today - increase irbesartan  to 300, is on 80 of telmisartan at home - home hydrochlorothiazide on hold  # HFpEF Euvolemic - cont home torsemide    DVT prophylaxis: lovenox  Code Status: full Family Communication: daughter at bedside 2/2  Level of care: Med-Surg Status is: Inpatient Remains inpatient appropriate because: pending CIR eval    Consultants:  neurosurgery  Procedures: See above  Antimicrobials:  Peri-operative    Subjective: Reports feeling tired  Objective: Vitals:   05/20/24 0422 05/20/24 0441 05/20/24 0804 05/20/24 1145  BP: (!) 145/67  125/65 (!) 153/65  Pulse: 65  (!) 57 62  Resp: 18  17 18   Temp: 98.5 F (36.9 C)  98.8 F (37.1 C) 98.1 F (36.7 C)  TempSrc: Oral  Oral Oral  SpO2: 97%  98% 99%  Weight:  98.3 kg    Height:        Intake/Output Summary (Last 24 hours) at 05/20/2024 1200 Last data filed at 05/19/2024 1446 Gross per 24 hour  Intake --  Output 600 ml  Net -600 ml   Filed Weights   05/18/24 0500 05/19/24 0500 05/20/24 0441  Weight: 98.5 kg  98.2 kg 98.3 kg    Examination:  General exam: Appears calm and comfortable  Respiratory system: Clear to auscultation. Respiratory effort normal. Cardiovascular system: S1 & S2 heard, RRR. No JVD, murmurs, rubs, gallops or clicks. No pedal edema. Gastrointestinal system: Abdomen is nondistended, soft and nontender. No organomegaly or masses felt. Normal bowel sounds heard. Central nervous system: mild left-sided weakness Extremities: Symmetric 5 x 5 power. Skin: healing cranial incision with staples Psychiatry: Judgement  and insight appear normal. Mood & affect appropriate.     Data Reviewed: I have personally reviewed following labs and imaging studies  CBC: Recent Labs  Lab 05/17/24 0315 05/18/24 0356 05/18/24 1437 05/18/24 2023 05/19/24 0344 05/20/24 0519  WBC 12.0* 17.3* 19.2*  --  13.1* 8.4  HGB 11.3* 8.5* 9.3* 8.8* 8.5* 8.6*  HCT 35.0* 25.8* 28.3* 26.5* 25.1* 25.5*  MCV 90.2 90.8 91.3  --  89.0 88.9  PLT 131* 137* 144*  --  119* 124*   Basic Metabolic Panel: Recent Labs  Lab 05/16/24 1810 05/17/24 0315 05/18/24 0356 05/19/24 0344 05/20/24 0519  NA 143 140 142 141 141  K 3.9 4.1 4.2 3.7 3.7  CL 106 104 106 107 109  CO2 24 24 27 28 25   GLUCOSE 133* 180* 147* 125* 110*  BUN 35* 33* 38* 39* 24*  CREATININE 1.53* 1.34* 1.30* 1.10 0.95  CALCIUM 9.4 9.0 8.5* 8.3* 8.4*  MG  --  2.2 2.5* 2.4 2.3  PHOS  --  4.0 3.1 2.1* 2.3*   GFR: Estimated Creatinine Clearance: 79.9 mL/min (by C-G formula based on SCr of 0.95 mg/dL). Liver Function Tests: No results for input(s): AST, ALT, ALKPHOS, BILITOT, PROT, ALBUMIN in the last 168 hours. No results for input(s): LIPASE, AMYLASE in the last 168 hours. No results for input(s): AMMONIA in the last 168 hours. Coagulation Profile: Recent Labs  Lab 05/16/24 1810 05/17/24 0759  INR 1.3* 1.1   Cardiac Enzymes: No results for input(s): CKTOTAL, CKMB, CKMBINDEX, TROPONINI in the last 168 hours. BNP (last 3 results) No results for input(s): PROBNP in the last 8760 hours. HbA1C: No results for input(s): HGBA1C in the last 72 hours. CBG: Recent Labs  Lab 05/19/24 1233 05/19/24 1630 05/19/24 2133 05/20/24 0805 05/20/24 1147  GLUCAP 99 111* 112* 110* 109*   Lipid Profile: No results for input(s): CHOL, HDL, LDLCALC, TRIG, CHOLHDL, LDLDIRECT in the last 72 hours. Thyroid  Function Tests: No results for input(s): TSH, T4TOTAL, FREET4, T3FREE, THYROIDAB in the last 72 hours. Anemia  Panel: No results for input(s): VITAMINB12, FOLATE, FERRITIN, TIBC, IRON, RETICCTPCT in the last 72 hours. Urine analysis:    Component Value Date/Time   APPEARANCEUR Clear 04/24/2020 1047   GLUCOSEU Negative 04/24/2020 1047   BILIRUBINUR Negative 04/24/2020 1047   PROTEINUR Negative 04/24/2020 1047   NITRITE Negative 04/24/2020 1047   LEUKOCYTESUR Negative 04/24/2020 1047   Sepsis Labs: @LABRCNTIP (procalcitonin:4,lacticidven:4)  ) Recent Results (from the past 240 hours)  MRSA Next Gen by PCR, Nasal     Status: None   Collection Time: 05/17/24  1:18 AM   Specimen: Nasal Mucosa; Nasal Swab  Result Value Ref Range Status   MRSA by PCR Next Gen NOT DETECTED NOT DETECTED Final    Comment: (NOTE) The GeneXpert MRSA Assay (FDA approved for NASAL specimens only), is one component of a comprehensive MRSA colonization surveillance program. It is not intended to diagnose MRSA infection nor to guide or monitor treatment for MRSA infections. Test performance is not FDA approved in  patients less than 77 years old. Performed at [facility], [address], [place], [place] [address]          Radiology Studies: CT HEAD WO CONTRAST ( ) Result Date: 05/18/2024 EXAM: CT HEAD WITHOUT CONTRAST 05/18/2024 05:57:02 PM TECHNIQUE: CT of the head was performed without the administration of intravenous contrast. Automated exposure control, iterative reconstruction, and/or weight based adjustment of the mA/kV was utilized to reduce the radiation dose to as low as reasonably achievable. COMPARISON: CT head without contrast 05/18/2024 7 am. CLINICAL HISTORY: Head trauma, minor (Age >= 65y). Craniotomy for evacuation of subdural hematoma. The drain was removed. FINDINGS: BRAIN AND VENTRICLES: Stable mixed density extra-axial hemorrhage over the right frontal convexity, measuring up to 10 mm on coronal images. Pneumocephalus has decreased slightly. Partial effacement of the sulci  over the right convexity is similar to the prior exam. Stable 8 mm right-to-left midline shift. 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 No hydrocephalus. ORBITS: No acute abnormality. SINUSES: No acute abnormality. SOFT TISSUES AND SKULL: Right frontoparietal craniotomy is again noted. The drain was removed. No acute soft tissue abnormality. No skull fracture. IMPRESSION: 1. Stable mixed density extra-axial hemorrhage over the right frontal convexity, measuring up to 10 mm on coronal images. 2. Stable right to left midline shift of 8 mm. 3. 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 4. Right frontoparietal craniotomy with interval removal of the drain. Electronically signed by: Lonni Necessary MD 05/18/2024 06:05 PM EST RP Workstation: [id number]        Scheduled Meds:  sodium chloride    Intravenous Once   acetaminophen   1,000 mg Oral Q6H   Chlorhexidine  Gluconate Cloth  6 each Topical Daily   enoxaparin  (LOVENOX ) injection  40 mg Subcutaneous Q24H   insulin  aspart  0-15 Units Subcutaneous TID WC   insulin  aspart  0-5 Units Subcutaneous QHS   irbesartan   150 mg Oral Daily   levETIRAcetam   500 mg Oral BID   metoprolol  succinate  25 mg Oral QHS   metoprolol  succinate  50 mg Oral q morning   torsemide   5 mg Oral Q1200   Continuous Infusions:   LOS: 4 days     Devaughn KATHEE Ban, MD [facility]   If 7PM-7AM, please contact night-coverage [email or web address] Password [email or web address] 05/20/2024, 12:00 PM     "

## 2024-05-20 NOTE — Progress Notes (Signed)
 Inpatient Rehab Admissions:  Inpatient Rehab Consult received.  I spoke with patient's daughter, [name], on the telephone for rehabilitation assessment and to discuss goals and expectations of an inpatient rehab admission.  Pt was participating in OT session at the time. Discussed average length of stay, insurance authorization requirement and discharge home after completion of CIR. [name] acknowledged understanding and is supportive of pt pursuing CIR. [name] confirmed that family will be able to provide 24/7 support for pt after discharge. Will continue to follow.  Signed: Tinnie Yvone Cohens, MS, CCC-SLP Admissions Coordinator [phone]

## 2024-05-20 NOTE — Plan of Care (Signed)
" °  Problem: Education: Goal: Knowledge of General Education information will improve Description: Including pain rating scale, medication(s)/side effects and non-pharmacologic comfort measures Outcome: Progressing   Problem: Health Behavior/Discharge Planning: Goal: Ability to manage health-related needs will improve Outcome: Progressing   Problem: Clinical Measurements: Goal: Ability to maintain clinical measurements within normal limits will improve Outcome: Progressing Goal: Will remain free from infection Outcome: Progressing Goal: Diagnostic test results will improve Outcome: Progressing Goal: Respiratory complications will improve Outcome: Progressing Goal: Cardiovascular complication will be avoided Outcome: Progressing   Problem: Activity: Goal: Risk for activity intolerance will decrease Outcome: Progressing   Problem: Nutrition: Goal: Adequate nutrition will be maintained Outcome: Progressing   Problem: Coping: Goal: Level of anxiety will decrease Outcome: Progressing   Problem: Elimination: Goal: Will not experience complications related to bowel motility Outcome: Progressing Goal: Will not experience complications related to urinary retention Outcome: Progressing   Problem: Pain Managment: Goal: General experience of comfort will improve and/or be controlled Outcome: Progressing   Problem: Safety: Goal: Ability to remain free from injury will improve Outcome: Progressing   Problem: Skin Integrity: Goal: Risk for impaired skin integrity will decrease Outcome: Progressing   Problem: Education: Goal: Knowledge of the prescribed therapeutic regimen will improve Outcome: Progressing   Problem: Bowel/Gastric: Goal: Gastrointestinal status for postoperative course will improve Outcome: Progressing   Problem: Cardiac: Goal: Ability to maintain an adequate cardiac output Outcome: Progressing Goal: Will show no evidence of cardiac arrhythmias Outcome:  Progressing   Problem: Nutritional: Goal: Will attain and maintain optimal nutritional status Outcome: Progressing   Problem: Neurological: Goal: Will regain or maintain usual level of consciousness Outcome: Progressing   Problem: Clinical Measurements: Goal: Ability to maintain clinical measurements within normal limits Outcome: Progressing Goal: Postoperative complications will be avoided or minimized Outcome: Progressing   Problem: Respiratory: Goal: Will regain and/or maintain adequate ventilation Outcome: Progressing Goal: Respiratory status will improve Outcome: Progressing   Problem: Skin Integrity: Goal: Demonstrates signs of wound healing without infection Outcome: Progressing   Problem: Urinary Elimination: Goal: Will remain free from infection Outcome: Progressing Goal: Ability to achieve and maintain adequate urine output Outcome: Progressing   Problem: Education: Goal: Ability to describe self-care measures that may prevent or decrease complications (Diabetes Survival Skills Education) will improve Outcome: Progressing   Problem: Coping: Goal: Ability to adjust to condition or change in health will improve Outcome: Progressing   Problem: Fluid Volume: Goal: Ability to maintain a balanced intake and output will improve Outcome: Progressing   Problem: Health Behavior/Discharge Planning: Goal: Ability to identify and utilize available resources and services will improve Outcome: Progressing Goal: Ability to manage health-related needs will improve Outcome: Progressing   Problem: Metabolic: Goal: Ability to maintain appropriate glucose levels will improve Outcome: Progressing   Problem: Nutritional: Goal: Maintenance of adequate nutrition will improve Outcome: Progressing Goal: Progress toward achieving an optimal weight will improve Outcome: Progressing   Problem: Skin Integrity: Goal: Risk for impaired skin integrity will decrease Outcome:  Progressing   Problem: Tissue Perfusion: Goal: Adequacy of tissue perfusion will improve Outcome: Progressing   "

## 2024-05-20 NOTE — Evaluation (Signed)
 Speech Language Pathology Evaluation Patient Details Name: Dominic Osborn MRN: [id number] DOB: 1945/08/31 Today's Date: 05/20/2024 Time: 8794-8764 SLP Time Calculation (min) (ACUTE ONLY): 30 min  Problem List:  Patient Active Problem List   Diagnosis Date Noted   Epidural hematoma (HCC) 05/17/2024   Subdural hematoma (HCC) 05/16/2024   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   Transient alteration of awareness 05/16/2024   History of atrial fibrillation 04/06/2018   Hx of ascending aorta repair 03/23/2018   S/P aortic valve replacement 03/23/2018   Ascending aortic aneurysm 09/25/2017   Essential (primary) hypertension 02/03/2014   Aortic valve, bicuspid 08/27/2013   Beat, premature ventricular 08/27/2013   Past Medical History:  Past Medical History:  Diagnosis Date   Anemia    Anxiety    Bleeding disorder    Dysrhythmia/Atrial fib    Elevated PSA    History of atrial fibrillation    Hypertension    PVC (premature ventricular contraction)    Thrombocytopenia    Tubular adenoma    Past Surgical History:  Past Surgical History:  Procedure Laterality Date   AORTA ANEURYSM REPAIR     CARDIAC VALVE REPLACEMENT     CARDIOVERSION N/A 02/22/2024   Procedure: CARDIOVERSION;  Surgeon: Wilburn Keller BROCKS, MD;  Location: [facility] ORS;  Service: Cardiovascular;  Laterality: N/A;   COLONOSCOPY N/A 05/01/2024   Procedure: COLONOSCOPY;  Surgeon: Toledo, Ladell POUR, MD;  Location: [facility] ENDOSCOPY;  Service: Gastroenterology;  Laterality: N/A;   COLONOSCOPY WITH PROPOFOL  N/A 10/01/2018   Procedure: COLONOSCOPY WITH PROPOFOL ;  Surgeon: Viktoria Lamar DASEN, MD;  Location: [facility] ENDOSCOPY;  Service: Endoscopy;  Laterality: N/A;   CRANIOTOMY Right 05/16/2024   Procedure: CRANIOTOMY HEMATOMA EVACUATION SUBDURAL;  Surgeon: Clois Fret, MD;  Location: [facility] ORS;  Service: Neurosurgery;  Laterality: Right;   CRANIOTOMY Right 05/17/2024   Procedure: CRANIOTOMY HEMATOMA EVACUATION SUBDURAL;  Surgeon:  Clois Fret, MD;  Location: [facility] ORS;  Service: Neurosurgery;  Laterality: Right;   ESOPHAGEAL DILATION  11/23/2022   Procedure: ESOPHAGEAL DILATION;  Surgeon: Toledo, Teodoro K, MD;  Location[facility] ENDOSCOPY;  Service: Gastroenterology;;   ESOPHAGOGASTRODUODENOSCOPY (EGD) WITH PROPOFOL  N/A 11/23/2022   Procedure: ESOPHAGOGASTRODUODENOSCOPY (EGD) WITH PROPOFOL ;  Surgeon: Toledo, Ladell POUR, MD;  Location: [facility] ENDOSCOPY;  Service: Gastroenterology;  Laterality: N/A;   TEE WITHOUT CARDIOVERSION N/A 02/22/2024   Procedure: ECHOCARDIOGRAM, TRANSESOPHAGEAL;  Surgeon: Alluri, Keller BROCKS, MD;  Location: [facility] ORS;  Service: Cardiovascular;  Laterality: N/A;   TONSILLECTOMY     HPI:  Pt admitted to [facility] on 05/16/24 for c/o mechanical fall where he hit his head, resulting in SDH. Imaging significant for Acute right cerebral convexity subdural hematoma measuring up to 1.8 cm with  associated mass effect, effacement of the right lateral ventricle, and right to left midline shift measuring 1.2 cm. S/p R craniotomy for evacuation of hematoma on 1/29 and 1/30 (due to worsening HA) by Dr. Clois. Significant PMH includes: HTN, hx AVR, and Afib (Eliquis).   Assessment / Plan / Recommendation Clinical Impression  Pt seen today at request of PT  for assessment of higher level cognition. Pt seen with his daughter present throughout evaluation. Pt appeared fatigue as he had just had PT and several visitors. He was also observed using tangential humor during moments of cognitive difficulty. Pt states that his goals is to be able to drive again, use the computer and personal spreadsheets.   Addenbrooke's Cognitive Examination - ACE III The Addenbrooke's Cognitive Examination-III (ACE-III) is a brief cognitive test that assesses  five cognitive domains. The total score is 100 with higher scores indicating better cognitive functioning. Cut off scores of 88 and 82 are recommended for suspicion of dementia (88 has  sensitivity of 1.00 and specificity of 0.96, 82 has sensitivity of 0.93 and specificity of 1.00). American Version A  Attention 17/18  Memory 21/26  Fluency 5/14  Language 23/26  Visuospatial 11/16  TOTAL ACE- III Score 77/100   Pt presents with mild cognitive impairment with deficits in complex selective attention, memory (short-term), verbal fluency (d/t deficits in executive function, memory and potentially cognitive flexibility), moderate to severe visuospatial deficits, emergent awareness of deficits and overall reduced executive functioning.   Skilled education provided on results of this assessment as well as caregiver education provided on ways to facilitate cognitive communication function:  Recommend opportunities to increase functional independence with self-feeding, object location.    SLP Assessment  SLP Recommendation/Assessment: Patient needs continued Speech Language Pathology Services SLP Visit Diagnosis: Cognitive communication deficit (R41.841);Frontal lobe and executive function deficit;Attention and concentration deficit     Assistance Recommended at Discharge  Intermittent Supervision/Assistance  Functional Status Assessment Patient has had a recent decline in their functional status and demonstrates the ability to make significant improvements in function in a reasonable and predictable amount of time.  Frequency and Duration min 2x/week  2 weeks      SLP Evaluation Cognition  Overall Cognitive Status: Impaired/Different from baseline Arousal/Alertness: Awake/alert Orientation Level: Oriented X4 Year: 2026 Month: February Day of Week: Correct Attention: Selective Selective Attention: Impaired Selective Attention Impairment: Verbal complex;Functional complex Memory: Impaired Memory Impairment: Retrieval deficit;Decreased short term memory Decreased Short Term Memory: Verbal complex;Functional complex Awareness: Impaired Awareness Impairment: Anticipatory  impairment Problem Solving: Impaired Problem Solving Impairment: Verbal complex;Functional complex Executive Function: Reasoning;Decision Making;Sequencing;Organizing Reasoning: Impaired Reasoning Impairment: Verbal complex;Functional complex Sequencing: Impaired Sequencing Impairment: Verbal complex;Functional complex Organizing: Impaired Organizing Impairment: Verbal complex;Functional complex Decision Making: Impaired Decision Making Impairment: Verbal complex;Functional complex Behaviors: Restless;Impulsive Safety/Judgment: Impaired       Comprehension  Auditory Comprehension Overall Auditory Comprehension: Appears within functional limits for tasks assessed Yes/No Questions: Within Functional Limits Commands: Within Functional Limits Conversation: Simple Reading Comprehension Reading Status: Within funtional limits    Expression Expression Primary Mode of Expression: Verbal Verbal Expression Overall Verbal Expression: Appears within functional limits for tasks assessed Initiation: No impairment Automatic Speech: Name;Social Response Level of Generative/Spontaneous Verbalization: Conversation Repetition: No impairment Naming: Impairment Confrontation: Within functional limits Divergent: 50-74% accurate Pragmatics: Impairment Impairments: Abnormal affect;Interpretation of nonverbal communication Interfering Components: Attention Non-Verbal Means of Communication: Not applicable Written Expression Dominant Hand: Right Written Expression: Within Functional Limits   Oral / Motor  Oral Motor/Sensory Function Overall Oral Motor/Sensory Function: Within functional limits Motor Speech Overall Motor Speech: Appears within functional limits for tasks assessed           Lise Pincus B. Rubbie, M.S., CCC-SLP, Tree Surgeon Certified Brain Injury Specialist [facility]  [facility] Rehabilitation Services Office [phone] Ascom  [phone] Fax [phone]

## 2024-05-21 NOTE — Progress Notes (Signed)
Per Dr Ashok Pall dc tele monitoring

## 2024-05-21 NOTE — Plan of Care (Signed)
" °  Problem: Education: Goal: Knowledge of General Education information will improve Description: Including pain rating scale, medication(s)/side effects and non-pharmacologic comfort measures Outcome: Progressing   Problem: Health Behavior/Discharge Planning: Goal: Ability to manage health-related needs will improve Outcome: Progressing   Problem: Clinical Measurements: Goal: Ability to maintain clinical measurements within normal limits will improve Outcome: Progressing Goal: Will remain free from infection Outcome: Progressing Goal: Diagnostic test results will improve Outcome: Progressing Goal: Respiratory complications will improve Outcome: Progressing Goal: Cardiovascular complication will be avoided Outcome: Progressing   Problem: Activity: Goal: Risk for activity intolerance will decrease Outcome: Progressing   Problem: Nutrition: Goal: Adequate nutrition will be maintained Outcome: Progressing   Problem: Coping: Goal: Level of anxiety will decrease Outcome: Progressing   Problem: Elimination: Goal: Will not experience complications related to bowel motility Outcome: Progressing Goal: Will not experience complications related to urinary retention Outcome: Progressing   Problem: Pain Managment: Goal: General experience of comfort will improve and/or be controlled Outcome: Progressing   Problem: Safety: Goal: Ability to remain free from injury will improve Outcome: Progressing   Problem: Skin Integrity: Goal: Risk for impaired skin integrity will decrease Outcome: Progressing   Problem: Education: Goal: Knowledge of the prescribed therapeutic regimen will improve Outcome: Progressing   Problem: Bowel/Gastric: Goal: Gastrointestinal status for postoperative course will improve Outcome: Progressing   Problem: Cardiac: Goal: Ability to maintain an adequate cardiac output Outcome: Progressing Goal: Will show no evidence of cardiac arrhythmias Outcome:  Progressing   Problem: Nutritional: Goal: Will attain and maintain optimal nutritional status Outcome: Progressing   Problem: Neurological: Goal: Will regain or maintain usual level of consciousness Outcome: Progressing   Problem: Clinical Measurements: Goal: Ability to maintain clinical measurements within normal limits Outcome: Progressing Goal: Postoperative complications will be avoided or minimized Outcome: Progressing   Problem: Respiratory: Goal: Will regain and/or maintain adequate ventilation Outcome: Progressing Goal: Respiratory status will improve Outcome: Progressing   Problem: Skin Integrity: Goal: Demonstrates signs of wound healing without infection Outcome: Progressing   Problem: Urinary Elimination: Goal: Will remain free from infection Outcome: Progressing Goal: Ability to achieve and maintain adequate urine output Outcome: Progressing   Problem: Education: Goal: Ability to describe self-care measures that may prevent or decrease complications (Diabetes Survival Skills Education) will improve Outcome: Progressing   Problem: Coping: Goal: Ability to adjust to condition or change in health will improve Outcome: Progressing   Problem: Fluid Volume: Goal: Ability to maintain a balanced intake and output will improve Outcome: Progressing   Problem: Health Behavior/Discharge Planning: Goal: Ability to identify and utilize available resources and services will improve Outcome: Progressing Goal: Ability to manage health-related needs will improve Outcome: Progressing   Problem: Metabolic: Goal: Ability to maintain appropriate glucose levels will improve Outcome: Progressing   Problem: Nutritional: Goal: Maintenance of adequate nutrition will improve Outcome: Progressing Goal: Progress toward achieving an optimal weight will improve Outcome: Progressing   Problem: Skin Integrity: Goal: Risk for impaired skin integrity will decrease Outcome:  Progressing   Problem: Tissue Perfusion: Goal: Adequacy of tissue perfusion will improve Outcome: Progressing   "

## 2024-05-21 NOTE — Plan of Care (Signed)

## 2024-05-21 NOTE — Progress Notes (Addendum)
" ° °  Neurosurgery Progress Note  History: Dominic Osborn is s/p right craniotomy for SDH  POD4/5: Pt complaining of a mild 2/10 headache this morning. Otherwise well  POD3/4: doing well POD2/3: Doing well. Needs BM POD1/2: Doing much better.  Minor headache. POD1: Pt complaining of a headache this morning   Physical Exam: Vitals:   05/21/24 0137 05/21/24 0536  BP: 126/61 (!) 125/59  Pulse: 60 (!) 59  Resp: 18 17  Temp: 99.5 F (37.5 C) 98.8 F (37.1 C)  SpO2: 98% 100%   Arouses easily. Oriented x 3 CNI  Strength:5/5 throughout RUE 5/5 4+/5 LUE throughout BLE   Incision c/d/I with running suture in place   Data:  Other tests/results:  05/17/24 FINDINGS:   BRAIN AND VENTRICLES: Interval postoperative changes of right craniotomy for subdural hematoma evacuation with decreased residual subdural hematoma measuring 1.6 cm thickness, previously 1.9 cm. Decreased leftward midline shift measuring 1.0 cm, previously 1.2 cm. Possible small subarachnoid hemorrhage along right frontal convexity. Pneumocephalus along anterior frontal convexities. No evidence of acute infarct. No hydrocephalus.   ORBITS: No acute abnormality.   SINUSES: Mucosal thickening in right maxillary sinus.   SOFT TISSUES AND SKULL: Scalp drain and skin staples in place. Postoperative right craniotomy changes. Gas in right temporalis region soft tissues related to recent surgery. No skull fracture.   IMPRESSION: 1. Interval postoperative changes of right craniotomy for subdural hematoma evacuation with decreased residual subdural hematoma (1.6 cm, previously 1.9 cm) and decreased leftward midline shift (1.0 cm, previously 1.2 cm). Scalp drain and skin staples in place. 2. Possible small subarachnoid hemorrhage along right frontal convexity. 3. Pneumocephalus along anterior frontal convexities, likely postoperative.   Electronically signed by: Evalene Coho MD 05/17/2024 06:02 AM EST  RP Workstation: [id number]  CT 05/18/24 Near resolution of hemorrhage from 1/30. MLS improved  2nd CT 1/31 - stable to slightly improved.  Labs reviewed - monitor H and H  Assessment/Plan:  Dominic Osborn is a 79 y.o s/p fall on ice resulting in right sided SDH with leftward shift. S/p craniotomy for evacuation and takeback for post-operative epidural hematoma  - Keppra  500 mg BID x 7 days (end 05/23/24) - DVT PPX ok to continue - Ok to restart Eliquis on 2/27. OK to resume aspirin 2/13 - PTOT, dispo planning  Edsel Goods PA-C Department of Neurosurgery  Addended by Reeves Daisy MD "

## 2024-05-21 NOTE — Progress Notes (Signed)
 Speech Language Pathology Treatment: Cognitive-Linguistic  Patient Details Name: Dominic Osborn MRN: [id number] DOB: 06-Apr-1946 Today's Date: 05/21/2024 Time: 8499-8481 SLP Time Calculation (min) (ACUTE ONLY): 18 min  Assessment / Plan / Recommendation Clinical Impression  Pt seen for ongoing assessment and treatment of cognitive impairment. Pt received in bed requesting to use the bathroom. Pt benefited from verbal cues for adjusting his left hand grip on walker and well as verbal cues to his left as he hit the doorframe with left wheel of his walker.   SLP further facilitated session by providing the Trailing Making Test.   TRAIL MAKING TEST (TMT) Parts A & B Both parts of the Trail Making Test consist of 25 circles distributed over a sheet of paper. In Part A, the circles are numbered 1 - 25, and the patient should draw lines to connect the numbers in ascending order. In Part B, the circles include both numbers (1 - 13) and letters (A - L); as in Part A, the patient draws lines to connect the circles in an ascending pattern, but with the added task of alternating between the numbers and letters (i.e., 1-A-2-B-3-C, etc.).   Results for both TMT A and B are reported as the number of seconds required to complete the task; therefore, higher scores reveal greater impairment.  Results:  Trail A - DEFICIENT - 125 seconds (n=29 seconds; Deficient > 78 seconds) Trail B - DEFICIENT - 297 seconds (n= 75 seconds; Deficient > 273 seconds)  TMTs have been shown to be significantly correlated with impaired driving on road tests by older drivers. TMT-A provided the best utility for determining a range of scores (68-90 sec) for which additional road testing would be indicated in general practice settings.(Papandonatos GD, Layla CAVES, [address], [place] PP, Carr DB. Clinical Utility of the Trail-Making Test as a Predictor of Driving Performance in Older Adults. J Am Geriatr Soc. 2015 Nov;63(11):2358-64. doi:  10.1111/jgs.13776. Epub 2015 Oct 27. PMID: 73496376; PMCID: EFR5338936.)    HPI HPI: Pt admitted to [facility] on 05/16/24 for c/o mechanical fall where he hit his head, resulting in SDH. Imaging significant for Acute right cerebral convexity subdural hematoma measuring up to 1.8 cm with  associated mass effect, effacement of the right lateral ventricle, and right to left midline shift measuring 1.2 cm. S/p R craniotomy for evacuation of hematoma on 1/29 and 1/30 (due to worsening HA) by Dr. Clois. Significant PMH includes: HTN, hx AVR, and Afib (Eliquis).      SLP Plan  Continue with current plan of care         Recommendations                    Oral care BID   Intermittent Supervision/Assistance Cognitive communication deficit (R41.841);Frontal lobe and executive function deficit;Attention and concentration deficit     Continue with current plan of care    Dominic Osborn, M.S., CCC-SLP, Tree Surgeon Certified Brain Injury Specialist [facility]  [facility] Rehabilitation Services Office [phone] Ascom [phone] Fax [phone]

## 2024-05-21 NOTE — Progress Notes (Signed)
 Inpatient Rehab Admissions Coordinator:  ?Insurance authorization started. Will continue to follow. ? ? ?Tinnie Yvone Cohens, MS, CCC-SLP ?Admissions Coordinator ?[phone] ? ?

## 2024-05-22 DIAGNOSIS — S065XAA Traumatic subdural hemorrhage with loss of consciousness status unknown, initial encounter: Secondary | ICD-10-CM | POA: Diagnosis not present

## 2024-05-22 DIAGNOSIS — G935 Compression of brain: Secondary | ICD-10-CM | POA: Diagnosis not present

## 2024-05-22 DIAGNOSIS — Z8679 Personal history of other diseases of the circulatory system: Secondary | ICD-10-CM

## 2024-05-22 DIAGNOSIS — R404 Transient alteration of awareness: Secondary | ICD-10-CM | POA: Diagnosis not present

## 2024-05-22 DIAGNOSIS — Z952 Presence of prosthetic heart valve: Secondary | ICD-10-CM

## 2024-05-22 DIAGNOSIS — S064XAA Epidural hemorrhage with loss of consciousness status unknown, initial encounter: Secondary | ICD-10-CM | POA: Diagnosis not present

## 2024-05-22 DIAGNOSIS — I10 Essential (primary) hypertension: Secondary | ICD-10-CM | POA: Diagnosis not present

## 2024-05-22 NOTE — Progress Notes (Signed)
 Speech Language Pathology Treatment: Cognitive-Linguistic  Patient Details Name: Dominic Osborn MRN: [id number] DOB: September 25, 1945 Today's Date: 05/22/2024 Time: 1025-1050 SLP Time Calculation (min) (ACUTE ONLY): 25 min  Assessment / Plan / Recommendation Clinical Impression  Skilled treatment session targeted pt's cognitive communication goals. SP facilitated session by providing the following interventions:  Pt was independent with selective attention to semi-complex task within a moderately distracting for 25 minutes. In addition, pt completed semi-complex monthly calendar task with 80% accuracy requiring Min A for emergent awareness of errors. Pt able to correct errors independently once he was aware of them.     HPI HPI: Pt admitted to [facility] on 05/16/24 for c/o mechanical fall where he hit his head, resulting in SDH. Imaging significant for Acute right cerebral convexity subdural hematoma measuring up to 1.8 cm with  associated mass effect, effacement of the right lateral ventricle, and right to left midline shift measuring 1.2 cm. S/p R craniotomy for evacuation of hematoma on 1/29 and 1/30 (due to worsening HA) by Dr. Clois. Significant PMH includes: HTN, hx AVR, and Afib (Eliquis).      SLP Plan  Continue with current plan of care         Recommendations                     Oral care BID   Intermittent Supervision/Assistance Cognitive communication deficit (R41.841);Frontal lobe and executive function deficit;Attention and concentration deficit     Continue with current plan of care    Narada Uzzle B. Rubbie, M.S., CCC-SLP, Tree Surgeon Certified Brain Injury Specialist [facility]  [facility] Rehabilitation Services Office [phone] Ascom [phone] Fax [phone]

## 2024-05-22 NOTE — Progress Notes (Shared)
Inpatient Rehab Admissions Coordinator:    I continue to await insurance auth for CIR.   Kishon Garriga, MS, CCC-SLP Rehab Admissions Coordinator  [phone] (celll) [phone] (office)  

## 2024-05-22 NOTE — NC FL2 (Signed)
 " [place]  MEDICAID [id number] LEVEL OF CARE FORM     IDENTIFICATION  Patient Name: Dominic Osborn Birthdate: July 18, 1945 Sex: male Admission Date (Current Location): 05/16/2024  [facility] and [place] Number:  Chiropodist and Address:  [facility], [address] [place], [place] [address]      Provider Number: [id number]  Attending Physician Name and Address:  Caleen Qualia, MD  Relative Name and Phone Number:  [name] Ferrari- [phone]    Current Level of Care: Hospital Recommended Level of Care: Skilled Nursing Facility Prior Approval Number:    Date Approved/Denied:   PASRR Number:    Discharge Plan: SNF    Current Diagnoses: Patient Active Problem List   Diagnosis Date Noted   Epidural hematoma (HCC) 05/17/2024   Subdural hematoma (HCC) 05/16/2024   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   Transient alteration of awareness 05/16/2024   History of atrial fibrillation 04/06/2018   Hx of ascending aorta repair 03/23/2018   S/P aortic valve replacement 03/23/2018   Ascending aortic aneurysm 09/25/2017   Essential (primary) hypertension 02/03/2014   Aortic valve, bicuspid 08/27/2013   Beat, premature ventricular 08/27/2013    Orientation RESPIRATION BLADDER Height & Weight     Self, Time, Situation, Place  Normal Continent Weight: 102 kg Height:  6' 4 (193 cm)  BEHAVIORAL SYMPTOMS/MOOD NEUROLOGICAL BOWEL NUTRITION STATUS      Continent Diet (heart healthy)  AMBULATORY STATUS COMMUNICATION OF NEEDS Skin   Supervision Verbally Surgical wounds (Staples scalp)                       Personal Care Assistance Level of Assistance  Bathing, Feeding, Dressing Bathing Assistance: Limited assistance Feeding assistance: Independent Dressing Assistance: Limited assistance     Functional Limitations Info  Sight, Hearing, Speech Sight Info: Adequate Hearing Info: Adequate Speech Info: Adequate    SPECIAL CARE FACTORS FREQUENCY  PT (By  licensed PT), OT (By licensed OT)     PT Frequency: 5 days per week OT Frequency: 5 days per week            Contractures Contractures Info: Not present    Additional Factors Info  Code Status, Allergies Code Status Info: Full Allergies Info: Quinolones           Current Medications (05/22/2024):  This is the current hospital active medication list Current Facility-Administered Medications  Medication Dose Route Frequency Provider Last Rate Last Admin   0.9 %  sodium chloride  infusion (Manually program via Guardrails IV Fluids)   Intravenous Once Gregory Edsel Ruth, PA       acetaminophen  (TYLENOL ) tablet 1,000 mg  1,000 mg Oral Q6H Gregory Edsel Ruth, PA   1,000 mg at 05/22/24 9049   enoxaparin  (LOVENOX ) injection 40 mg  40 mg Subcutaneous Q24H Clois Fret, MD   40 mg at 05/21/24 2013   irbesartan  (AVAPRO ) tablet 300 mg  300 mg Oral Daily Kandis Devaughn Sayres, MD   300 mg at 05/22/24 9049   levETIRAcetam  (KEPPRA ) tablet 500 mg  500 mg Oral BID Clois Fret, MD   500 mg at 05/22/24 9049   metoprolol  succinate (TOPROL -XL) 24 hr tablet 25 mg  25 mg Oral QHS Koch, Danielle Lee, PA   25 mg at 05/21/24 2011   metoprolol  succinate (TOPROL -XL) 24 hr tablet 50 mg  50 mg Oral q morning Koch, Danielle Lee, PA   50 mg at 05/22/24 0950   ondansetron  (ZOFRAN ) injection 4 mg  4 mg Intravenous Q6H PRN Kathrene Almarie Bake, NP       oxyCODONE  (Oxy IR/ROXICODONE ) immediate release tablet 5 mg  5 mg Oral Q6H PRN Clois Fret, MD   5 mg at 05/22/24 1232   polyethylene glycol (MIRALAX  / GLYCOLAX ) packet 17 g  17 g Oral Daily PRN Gregory Edsel Ruth, PA   17 g at 05/19/24 9047   senna (SENOKOT) tablet 8.6 mg  1 tablet Oral BID PRN Gregory Edsel Ruth, PA   8.6 mg at 05/19/24 2106   torsemide  (DEMADEX ) tablet 5 mg  5 mg Oral Q1200 Gregory Edsel Ruth, PA   5 mg at 05/22/24 1148   traZODone  (DESYREL ) tablet 50 mg  50 mg Oral QHS PRN Kandis Devaughn Sayres, MD   50 mg at 05/21/24 2012      Discharge Medications: Please see discharge summary for a list of discharge medications.  Relevant Imaging Results:  Relevant Lab Results:   Additional Information SS# [id number]  Nathanael CHRISTELLA Ring, RN     "

## 2024-05-22 NOTE — Plan of Care (Signed)
" °  Problem: Education: Goal: Knowledge of General Education information will improve Description: Including pain rating scale, medication(s)/side effects and non-pharmacologic comfort measures Outcome: Progressing   Problem: Health Behavior/Discharge Planning: Goal: Ability to manage health-related needs will improve Outcome: Progressing   Problem: Clinical Measurements: Goal: Ability to maintain clinical measurements within normal limits will improve Outcome: Progressing Goal: Will remain free from infection Outcome: Progressing Goal: Diagnostic test results will improve Outcome: Progressing Goal: Respiratory complications will improve Outcome: Progressing Goal: Cardiovascular complication will be avoided Outcome: Progressing   Problem: Activity: Goal: Risk for activity intolerance will decrease Outcome: Progressing   Problem: Nutrition: Goal: Adequate nutrition will be maintained Outcome: Progressing   Problem: Coping: Goal: Level of anxiety will decrease Outcome: Progressing   Problem: Elimination: Goal: Will not experience complications related to bowel motility Outcome: Progressing Goal: Will not experience complications related to urinary retention Outcome: Progressing   Problem: Pain Managment: Goal: General experience of comfort will improve and/or be controlled Outcome: Progressing   Problem: Safety: Goal: Ability to remain free from injury will improve Outcome: Progressing   Problem: Skin Integrity: Goal: Risk for impaired skin integrity will decrease Outcome: Progressing   Problem: Education: Goal: Knowledge of the prescribed therapeutic regimen will improve Outcome: Progressing   Problem: Bowel/Gastric: Goal: Gastrointestinal status for postoperative course will improve Outcome: Progressing   Problem: Cardiac: Goal: Ability to maintain an adequate cardiac output Outcome: Progressing Goal: Will show no evidence of cardiac arrhythmias Outcome:  Progressing   Problem: Nutritional: Goal: Will attain and maintain optimal nutritional status Outcome: Progressing   Problem: Neurological: Goal: Will regain or maintain usual level of consciousness Outcome: Progressing   Problem: Clinical Measurements: Goal: Ability to maintain clinical measurements within normal limits Outcome: Progressing Goal: Postoperative complications will be avoided or minimized Outcome: Progressing   Problem: Respiratory: Goal: Will regain and/or maintain adequate ventilation Outcome: Progressing Goal: Respiratory status will improve Outcome: Progressing   Problem: Skin Integrity: Goal: Demonstrates signs of wound healing without infection Outcome: Progressing   Problem: Urinary Elimination: Goal: Will remain free from infection Outcome: Progressing Goal: Ability to achieve and maintain adequate urine output Outcome: Progressing   Problem: Education: Goal: Ability to describe self-care measures that may prevent or decrease complications (Diabetes Survival Skills Education) will improve Outcome: Progressing   Problem: Coping: Goal: Ability to adjust to condition or change in health will improve Outcome: Progressing   Problem: Fluid Volume: Goal: Ability to maintain a balanced intake and output will improve Outcome: Progressing   Problem: Health Behavior/Discharge Planning: Goal: Ability to identify and utilize available resources and services will improve Outcome: Progressing Goal: Ability to manage health-related needs will improve Outcome: Progressing   Problem: Metabolic: Goal: Ability to maintain appropriate glucose levels will improve Outcome: Progressing   Problem: Nutritional: Goal: Maintenance of adequate nutrition will improve Outcome: Progressing Goal: Progress toward achieving an optimal weight will improve Outcome: Progressing   Problem: Skin Integrity: Goal: Risk for impaired skin integrity will decrease Outcome:  Progressing   Problem: Tissue Perfusion: Goal: Adequacy of tissue perfusion will improve Outcome: Progressing   "

## 2024-05-22 NOTE — Progress Notes (Addendum)
 Inpatient Rehab Admissions Coordinator:   Pt.'s case for CIR denied by insurance. Spoke with Pt., wife, and daughter and they do not wish to appeal, prefer SNF placement. I will sign off.  Leita Kleine, MS, CCC-SLP Rehab Admissions Coordinator  [phone] (celll) [phone] (office)

## 2024-05-22 NOTE — Progress Notes (Signed)
 " PROGRESS NOTE    Dominic Osborn  FMW:[id number] DOB: 1946-01-21 DOA: 05/16/2024 PCP: Cleotilde Oneil FALCON, MD     Brief Narrative:   Dominic Osborn is a 79 year old male with a pmh of hypertension and atrial fibrillation on eliquis who presented to [facility] ED following a fall. It was reported that he fell between 3-4pm on 05/16/24. He was able to contact his daughter to pick him up and promptly brought him to the ED.    History gathered per chart review and evaluation at bedside It was reported that upon arrival to [facility] ED initially, he was able to ambulate independently and was conversing with family and hospital staff.    ED course: Upon arrival the patient reported a left-sided headache with wrist pain following his fall. He was taken for CT scan. While waiting for CT results, he mental status began to decline. Radiology contacted the EDP with concern for subdural hemorrhage. Per his daughter he is compliant with his medications and took eliquis the morning of his fall.  1/29: Admitted following a fall, found to have a subdural hematoma with midline shift requiring surgical intervention. PCCM consulted for admission. 1/30: Pt with c/o worsening headaches and neurologic condition worsened CT Head revealed subdural hematoma pt taken back to the OR for evacuation of hematoma  1/31: No acute events overnight vital signs stable.  PT/OT consulted  2/4: Insurance denied CIR, patient does not want to appeal, TOC is not working on SNF placement.  Assessment & Plan:   Principal Problem:   Subdural hematoma (HCC) Active Problems:   Essential (primary) hypertension   History of atrial fibrillation   S/P aortic valve replacement   Compression of brain (HCC)   Transient alteration of awareness   Epidural hematoma (HCC)  # Traumatic subdural hematoma # Epidural bleeding Secondary to accidental fall on ice.  Patient was on Eliquis which was reversed with Kcentra .   Evacuated by neurosurgery on 1/29,  taken back to OR on 1/30 for epidural bleeding that was evacuated. Now stable - PT advising CIR, unfortunately insurance denied and now TOC is working for SNF - neurosurgery following - continue keppra  through 2/5 - pain control, bowel regimen  # Acute blood loss anemia Hgb has settled in the 8s. Combined EBL 500 ml - trend  # A-fib S/p cardioversion last year - neurosurgery advises holding anticoagulation 4 weeks, family says they are inclined to hold until they follow up with cardiology. Neurosurgery OK with antiplatelets beginning 2/13, if needed. OK for dvt ppx now. - cont home metop  # HTN Bp controlled - cont irbesartan  300, is on 80 of telmisartan at home - home hydrochlorothiazide on hold.  # HFpEF Euvolemic - cont home torsemide    DVT prophylaxis: lovenox  Code Status: full Family Communication: Discussed with wife at bedside  Level of care: Med-Surg Status is: Inpatient Remains inpatient appropriate because: CIF was denied by insurance, awaiting SNF placement   Consultants:  neurosurgery  Procedures: Craniotomy x 2  Antimicrobials:  Peri-operative    Subjective:  Patient was seen and examined today.  Still having mild headache but stating it is better than before.  Little disappointed that insurance denied CIR.  Objective: Vitals:   05/21/24 2138 05/22/24 0351 05/22/24 0608 05/22/24 0929  BP: 128/64  135/73 138/88  Pulse: (!) 56  65 66  Resp: 18  18 18   Temp: 98.5 F (36.9 C)  99.8 F (37.7 C) 98.8 F (37.1 C)  TempSrc: Oral  Oral   SpO2: 100%  98% 99%  Weight:  102 kg    Height:        Intake/Output Summary (Last 24 hours) at 05/22/2024 1432 Last data filed at 05/21/2024 1750 Gross per 24 hour  Intake 0 ml  Output --  Net 0 ml   Filed Weights   05/20/24 0441 05/21/24 0500 05/22/24 0351  Weight: 98.3 kg 101.1 kg 102 kg    Examination:  General.  Frail elderly man, in no acute distress.  Right-sided craniotomy staples. Pulmonary.   Lungs clear bilaterally, normal respiratory effort. CV.  Regular rate and rhythm, no JVD, rub or murmur. Abdomen.  Soft, nontender, nondistended, BS positive. CNS.  Alert and oriented .  No focal neurologic deficit. Extremities.  No edema,  pulses intact and symmetrical. Psychiatry.  Judgment and insight appears normal.     Data Reviewed: I have personally reviewed following labs and imaging studies  CBC: Recent Labs  Lab 05/17/24 0315 05/18/24 0356 05/18/24 1437 05/18/24 2023 05/19/24 0344 05/20/24 0519  WBC 12.0* 17.3* 19.2*  --  13.1* 8.4  HGB 11.3* 8.5* 9.3* 8.8* 8.5* 8.6*  HCT 35.0* 25.8* 28.3* 26.5* 25.1* 25.5*  MCV 90.2 90.8 91.3  --  89.0 88.9  PLT 131* 137* 144*  --  119* 124*   Basic Metabolic Panel: Recent Labs  Lab 05/16/24 1810 05/17/24 0315 05/18/24 0356 05/19/24 0344 05/20/24 0519  NA 143 140 142 141 141  K 3.9 4.1 4.2 3.7 3.7  CL 106 104 106 107 109  CO2 24 24 27 28 25   GLUCOSE 133* 180* 147* 125* 110*  BUN 35* 33* 38* 39* 24*  CREATININE 1.53* 1.34* 1.30* 1.10 0.95  CALCIUM 9.4 9.0 8.5* 8.3* 8.4*  MG  --  2.2 2.5* 2.4 2.3  PHOS  --  4.0 3.1 2.1* 2.3*   GFR: Estimated Creatinine Clearance: 79.9 mL/min (by C-G formula based on SCr of 0.95 mg/dL). Liver Function Tests: No results for input(s): AST, ALT, ALKPHOS, BILITOT, PROT, ALBUMIN in the last 168 hours. No results for input(s): LIPASE, AMYLASE in the last 168 hours. No results for input(s): AMMONIA in the last 168 hours. Coagulation Profile: Recent Labs  Lab 05/16/24 1810 05/17/24 0759  INR 1.3* 1.1   Cardiac Enzymes: No results for input(s): CKTOTAL, CKMB, CKMBINDEX, TROPONINI in the last 168 hours. BNP (last 3 results) No results for input(s): PROBNP in the last 8760 hours. HbA1C: No results for input(s): HGBA1C in the last 72 hours. CBG: Recent Labs  Lab 05/19/24 1233 05/19/24 1630 05/19/24 2133 05/20/24 0805 05/20/24 1147  GLUCAP 99 111* 112*  110* 109*   Lipid Profile: No results for input(s): CHOL, HDL, LDLCALC, TRIG, CHOLHDL, LDLDIRECT in the last 72 hours. Thyroid  Function Tests: No results for input(s): TSH, T4TOTAL, FREET4, T3FREE, THYROIDAB in the last 72 hours. Anemia Panel: No results for input(s): VITAMINB12, FOLATE, FERRITIN, TIBC, IRON, RETICCTPCT in the last 72 hours. Urine analysis:    Component Value Date/Time   APPEARANCEUR Clear 04/24/2020 1047   GLUCOSEU Negative 04/24/2020 1047   BILIRUBINUR Negative 04/24/2020 1047   PROTEINUR Negative 04/24/2020 1047   NITRITE Negative 04/24/2020 1047   LEUKOCYTESUR Negative 04/24/2020 1047   Sepsis Labs: @LABRCNTIP (procalcitonin:4,lacticidven:4)  ) Recent Results (from the past 240 hours)  MRSA Next Gen by PCR, Nasal     Status: None   Collection Time: 05/17/24  1:18 AM   Specimen: Nasal Mucosa; Nasal Swab  Result Value Ref Range Status   MRSA  by PCR Next Gen NOT DETECTED NOT DETECTED Final    Comment: (NOTE) The GeneXpert MRSA Assay (FDA approved for NASAL specimens only), is one component of a comprehensive MRSA colonization surveillance program. It is not intended to diagnose MRSA infection nor to guide or monitor treatment for MRSA infections. Test performance is not FDA approved in patients less than 21 years old. Performed at [facility], [address] [place], [place] [address]     Radiology Studies: No results found.  Scheduled Meds:  sodium chloride    Intravenous Once   acetaminophen   1,000 mg Oral Q6H   enoxaparin  (LOVENOX ) injection  40 mg Subcutaneous Q24H   irbesartan   300 mg Oral Daily   levETIRAcetam   500 mg Oral BID   metoprolol  succinate  25 mg Oral QHS   metoprolol  succinate  50 mg Oral q morning   torsemide   5 mg Oral Q1200   Continuous Infusions:   LOS: 6 days   This record has been created using Conservation officer, historic buildings. Errors have been sought and corrected,but may not  always be located. Such creation errors do not reflect on the standard of care.   Amaryllis Dare, MD [facility]   If 7PM-7AM, please contact night-coverage [email or web address] Password [email or web address] 05/22/2024, 2:32 PM     "

## 2024-05-22 NOTE — Progress Notes (Signed)
 Physical Therapy Treatment Patient Details Name: Dominic Osborn MRN: [id number] DOB: 1945-09-29 Today's Date: 05/22/2024   History of Present Illness Pt admitted to [facility] on 05/16/24 for c/o mechanical fall where he hit his head, resulting in SDH. Imaging significant for Acute right cerebral convexity subdural hematoma measuring up to 1.8 cm with  associated mass effect, effacement of the right lateral ventricle, and right to left midline shift measuring 1.2 cm. S/p R craniotomy for evacuation of hematoma on 1/29 and 1/30 (due to worsening HA) by Dr. Clois. Significant PMH includes: HTN, hx AVR, and Afib (Eliquis).    PT Comments  Pt pleasant and motivated t/o the session, despite initially stating he was too tired to do anything encouragement from family and PT got him going and he ultimately participate with extensive balance and gait exercises, challenges and training.  His O2 was in the high 90s t/o session and HR was stable in the 70s pre and post ambulation.  Pt does display wide based, guarded posture and had some issues with narrow based challenges, but ultimately did quite well and showed good effort with all the activities presented to him.  Pt will benefit from ongoing PT to address functional and safety limitations.     If plan is discharge home, recommend the following: A little help with bathing/dressing/bathroom;Assistance with cooking/housework;Direct supervision/assist for medications management;Direct supervision/assist for financial management;Assist for transportation;Help with stairs or ramp for entrance;Supervision due to cognitive status;A little help with walking and/or transfers   Can travel by private vehicle     Yes  Equipment Recommendations  Other (comment) (TBD at next venue)    Recommendations for Other Services       Precautions / Restrictions Precautions Precautions: Fall Recall of Precautions/Restrictions: Intact Restrictions Weight Bearing Restrictions  Per Provider Order: No     Mobility  Bed Mobility Overal bed mobility: Needs Assistance Bed Mobility: Sit to Supine, Supine to Sit     Supine to sit: Contact guard, Used rails Sit to supine: Contact guard assist, Used rails   General bed mobility comments: CGA for safety for sup<>sit transfer, HOB elevated, increased time/effort for BLE facilitation    Transfers Overall transfer level: Needs assistance Equipment used: None Transfers: Sit to/from Stand Sit to Stand: Contact guard assist           General transfer comment: pt able to rise multiple times from standard height bed.  CGA w/o need for physical assist with each effort    Ambulation/Gait Ambulation/Gait assistance: Contact guard assist Gait Distance (Feet): 200 Feet Assistive device: None         General Gait Details: Close CGA without need for direct assist t/o prolonged ambulation effort.  He tended to have wide BOS, lateral leaning gait with inconsistent foot clearance and hunched posture (baseline) but had no LOBs and was able to attempt gait challenges given to him (speed changes, stop/turn, high stepping, minimal head turning).   Stairs             Wheelchair Mobility     Tilt Bed    Modified Rankin (Stroke Patients Only)       Balance Overall balance assessment: Needs assistance Sitting-balance support: Feet supported, No upper extremity supported Sitting balance-Leahy Scale: Good     Standing balance support: No upper extremity supported, During functional activity Standing balance-Leahy Scale: Good Standing balance comment: struggled with narrower BOS challenges, no LOBs with many challenges  Communication Communication Communication: No apparent difficulties  Cognition Arousal: Alert Behavior During Therapy: WFL for tasks assessed/performed   PT - Cognitive impairments: Initiation, Sequencing                       PT -  Cognition Comments: delayed processing - family reporting much closer to baseline Following commands: Intact Following commands impaired: Follows multi-step commands with increased time    Cueing Cueing Techniques: Verbal cues, Tactile cues  Exercises      General Comments General comments (skin integrity, edema, etc.): balance challenges multiple bouts of each: NBOS static standing EO.EC, shld width EC with min/mod perterbations, heel raises with faded UE support, toe taps w/o UEs, shadow boxing to moving targets outside BOS, tandem static standing (unable to assume true tandem)      Pertinent Vitals/Pain Pain Assessment Pain Assessment: 0-10 Pain Score: 3  Pain Location: head ache/sore    Home Living                          Prior Function            PT Goals (current goals can now be found in the care plan section) Progress towards PT goals: Progressing toward goals    Frequency    Min 3X/week      PT Plan      Co-evaluation              AM-PAC PT 6 Clicks Mobility   Outcome Measure  Help needed turning from your back to your side while in a flat bed without using bedrails?: None Help needed moving from lying on your back to sitting on the side of a flat bed without using bedrails?: None Help needed moving to and from a bed to a chair (including a wheelchair)?: A Little Help needed standing up from a chair using your arms (e.g., wheelchair or bedside chair)?: A Little Help needed to walk in hospital room?: A Little Help needed climbing 3-5 steps with a railing? : A Little 6 Click Score: 20    End of Session Equipment Utilized During Treatment: Gait belt Activity Tolerance: Patient tolerated treatment well Patient left: in bed;with call bell/phone within reach;with bed alarm set;with family/visitor present Nurse Communication: Mobility status PT Visit Diagnosis: Unsteadiness on feet (R26.81);Muscle weakness (generalized) (M62.81);Difficulty  in walking, not elsewhere classified (R26.2);Hemiplegia and hemiparesis Hemiplegia - Right/Left: Left Hemiplegia - dominant/non-dominant: Non-dominant Hemiplegia - caused by: Unspecified     Time: 1729-1810 PT Time Calculation (min) (ACUTE ONLY): 41 min  Charges:    $Gait Training: 8-22 mins $Therapeutic Activity: 23-37 mins PT General Charges $$ ACUTE PT VISIT: 1 Visit                     Carmin JONELLE Deed, DPT 05/22/2024, 6:28 PM

## 2024-05-22 NOTE — TOC PASRR Note (Signed)
 RE:   Dominic Osborn  Date of Birth:   04-13-1947_____  Date:  05/22/2024      To Whom It May Concern:  Please be advised that the above-named patient will require a short-term nursing home stay - anticipated 30 days or less for rehabilitation and strengthening.  The plan is for return home.

## 2024-05-22 NOTE — Progress Notes (Signed)
" ° °  Neurosurgery Progress Note  History: Dominic Osborn is s/p right craniotomy for SDH  POD5/6: Pt continues to complain of mild 2/10 headache this morning. Reports that his PRN Oxycodone  manages this well.  POD4/5: Pt complaining of a mild 2/10 headache this morning. Otherwise well  POD3/4: doing well POD2/3: Doing well. Needs BM POD1/2: Doing much better.  Minor headache. POD1: Pt complaining of a headache this morning   Physical Exam: Vitals:   05/21/24 2138 05/22/24 0608  BP: 128/64 135/73  Pulse: (!) 56 65  Resp: 18 18  Temp: 98.5 F (36.9 C) 99.8 F (37.7 C)  SpO2: 100% 98%   Arouses easily. Oriented x 3 CNI  Strength:5/5 throughout RUE 4+/5 LUE. 5/5 throughout BLE   Incision c/d/I with running suture in place   Data:  Other tests/results:  05/17/24 FINDINGS:   BRAIN AND VENTRICLES: Interval postoperative changes of right craniotomy for subdural hematoma evacuation with decreased residual subdural hematoma measuring 1.6 cm thickness, previously 1.9 cm. Decreased leftward midline shift measuring 1.0 cm, previously 1.2 cm. Possible small subarachnoid hemorrhage along right frontal convexity. Pneumocephalus along anterior frontal convexities. No evidence of acute infarct. No hydrocephalus.   ORBITS: No acute abnormality.   SINUSES: Mucosal thickening in right maxillary sinus.   SOFT TISSUES AND SKULL: Scalp drain and skin staples in place. Postoperative right craniotomy changes. Gas in right temporalis region soft tissues related to recent surgery. No skull fracture.   IMPRESSION: 1. Interval postoperative changes of right craniotomy for subdural hematoma evacuation with decreased residual subdural hematoma (1.6 cm, previously 1.9 cm) and decreased leftward midline shift (1.0 cm, previously 1.2 cm). Scalp drain and skin staples in place. 2. Possible small subarachnoid hemorrhage along right frontal convexity. 3. Pneumocephalus along anterior frontal  convexities, likely postoperative.   Electronically signed by: Evalene Coho MD 05/17/2024 06:02 AM EST RP Workstation: [id number]  CT 05/18/24 Near resolution of hemorrhage from 1/30. MLS improved  2nd CT 1/31 - stable to slightly improved.  Labs reviewed - monitor H and H  Assessment/Plan:  Dominic Osborn is a 79 y.o s/p fall on ice resulting in right sided SDH with leftward shift. S/p craniotomy for evacuation and takeback for post-operative epidural hematoma  - Keppra  500 mg BID x 7 days (end 05/23/24) - DVT PPX ok to continue - Ok to restart Eliquis on 2/27. OK to resume aspirin 2/13 - PTOT, dispo planning underway. Therapy recommended CIR. Auth pending. Pt is stable for discharge from neurosurgical standpoint  Edsel Goods PA-C Department of Neurosurgery  "

## 2024-05-22 NOTE — TOC Progression Note (Signed)
 Transition of Care [facility]) - Progression Note    Patient Details  Name: Dominic Osborn MRN: [id number] Date of Birth: [birth year]  [facility] CM/SW Contact  Nathanael CHRISTELLA Ring, RN Phone Number: 05/22/2024, 10:37 AM  Clinical Narrative:     CM checked in with [facility] inpatient rehab admissions.  Insurance authorization is still pending.                     Expected Discharge Plan and Services                                               Social Drivers of Health (SDOH) Interventions SDOH Screenings   Food Insecurity: No Food Insecurity (05/17/2024)  Housing: Low Risk (05/17/2024)  Transportation Needs: No Transportation Needs (05/17/2024)  Utilities: Not At Risk (05/17/2024)  Financial Resource Strain: Low Risk  (09/14/2023)   Received from [facility]  Social Connections: Socially Integrated (05/17/2024)  Tobacco Use: Low Risk (05/16/2024)    Readmission Risk Interventions     No data to display

## 2024-05-23 ENCOUNTER — Inpatient Hospital Stay

## 2024-05-23 DIAGNOSIS — I10 Essential (primary) hypertension: Secondary | ICD-10-CM | POA: Diagnosis not present

## 2024-05-23 DIAGNOSIS — S065XAA Traumatic subdural hemorrhage with loss of consciousness status unknown, initial encounter: Secondary | ICD-10-CM | POA: Diagnosis not present

## 2024-05-23 DIAGNOSIS — S064XAA Epidural hemorrhage with loss of consciousness status unknown, initial encounter: Secondary | ICD-10-CM | POA: Diagnosis not present

## 2024-05-23 DIAGNOSIS — G935 Compression of brain: Secondary | ICD-10-CM | POA: Diagnosis not present

## 2024-05-23 LAB — BASIC METABOLIC PANEL WITH GFR
Anion gap: 9 (ref 5–15)
BUN: 21 mg/dL (ref 8–23)
CO2: 25 mmol/L (ref 22–32)
Calcium: 8.4 mg/dL — ABNORMAL LOW (ref 8.9–10.3)
Chloride: 102 mmol/L (ref 98–111)
Creatinine, Ser: 1.04 mg/dL (ref 0.61–1.24)
GFR, Estimated: >60 mL/min
Glucose, Bld: 104 mg/dL — ABNORMAL HIGH (ref 70–99)
Potassium: 3.7 mmol/L (ref 3.5–5.1)
Sodium: 136 mmol/L (ref 135–145)

## 2024-05-23 LAB — CBC
HCT: 26.9 % — ABNORMAL LOW (ref 39.0–52.0)
Hemoglobin: 9.1 g/dL — ABNORMAL LOW (ref 13.0–17.0)
MCH: 29.9 pg (ref 26.0–34.0)
MCHC: 33.8 g/dL (ref 30.0–36.0)
MCV: 88.5 fL (ref 80.0–100.0)
Platelets: 185 10*3/uL (ref 150–400)
RBC: 3.04 MIL/uL — ABNORMAL LOW (ref 4.22–5.81)
RDW: 13.3 % (ref 11.5–15.5)
WBC: 8.5 10*3/uL (ref 4.0–10.5)
nRBC: 0 % (ref 0.0–0.2)

## 2024-05-23 MED ORDER — BUTALBITAL-APAP-CAFFEINE 50-325-40 MG PO TABS
1.0000 | ORAL_TABLET | ORAL | Status: AC | PRN
  Administered 2024-05-23: 1 via ORAL
  Filled 2024-05-23: qty 1

## 2024-05-23 NOTE — Plan of Care (Signed)
" °  Problem: Education: Goal: Knowledge of General Education information will improve Description: Including pain rating scale, medication(s)/side effects and non-pharmacologic comfort measures Outcome: Progressing   Problem: Clinical Measurements: Goal: Ability to maintain clinical measurements within normal limits will improve Outcome: Progressing   Problem: Activity: Goal: Risk for activity intolerance will decrease Outcome: Progressing   Problem: Nutrition: Goal: Adequate nutrition will be maintained Outcome: Progressing   Problem: Coping: Goal: Level of anxiety will decrease Outcome: Progressing   Problem: Skin Integrity: Goal: Risk for impaired skin integrity will decrease Outcome: Progressing   Problem: Education: Goal: Knowledge of the prescribed therapeutic regimen will improve Outcome: Progressing   Problem: Nutritional: Goal: Will attain and maintain optimal nutritional status Outcome: Progressing   Problem: Neurological: Goal: Will regain or maintain usual level of consciousness Outcome: Progressing   Problem: Respiratory: Goal: Will regain and/or maintain adequate ventilation Outcome: Progressing   Problem: Skin Integrity: Goal: Demonstrates signs of wound healing without infection Outcome: Progressing   Problem: Urinary Elimination: Goal: Will remain free from infection Outcome: Progressing   Problem: Coping: Goal: Ability to adjust to condition or change in health will improve Outcome: Progressing   Problem: Metabolic: Goal: Ability to maintain appropriate glucose levels will improve Outcome: Progressing   Problem: Nutritional: Goal: Maintenance of adequate nutrition will improve Outcome: Progressing   Problem: Tissue Perfusion: Goal: Adequacy of tissue perfusion will improve Outcome: Progressing   "

## 2024-05-23 NOTE — TOC Progression Note (Signed)
 Transition of Care [facility]) - Progression Note    Patient Details  Name: Dominic Osborn MRN: [id number] Date of Birth: [birth year]  Transition of Care [facility]) CM/SW Contact  Nathanael CHRISTELLA Ring, RN Phone Number: 05/23/2024, 3:10 PM  Clinical Narrative:    Talked with patient and daughter at the bedside, only bed offer is [facility] other facilities are not in network with [employer].  Patient is in agreement with [facility].  Bed offer accepted in the Hub and insurance authorization started.                       Expected Discharge Plan and Services                                               Social Drivers of Health (SDOH) Interventions SDOH Screenings   Food Insecurity: No Food Insecurity (05/17/2024)  Housing: Low Risk (05/17/2024)  Transportation Needs: No Transportation Needs (05/17/2024)  Utilities: Not At Risk (05/17/2024)  Financial Resource Strain: Low Risk  (09/14/2023)   Received from [facility]  Social Connections: Socially Integrated (05/23/2024)  Tobacco Use: Low Risk (05/16/2024)    Readmission Risk Interventions     No data to display

## 2024-05-23 NOTE — Progress Notes (Signed)
" ° °  Neurosurgery Progress Note  History: Dominic Osborn is s/p right craniotomy for SDH  POD 6/7:Pt continues to complain of mild 2/10 headache. No other changes to pt condition. CT scan was ordered by IM this morning for headaches reported to RN POD5/6: Pt continues to complain of mild 2/10 headache this morning. Reports that his PRN Oxycodone  manages this well.  POD4/5: Pt complaining of a mild 2/10 headache this morning. Otherwise well  POD3/4: doing well POD2/3: Doing well. Needs BM POD1/2: Doing much better.  Minor headache. POD1: Pt complaining of a headache this morning   Physical Exam: Vitals:   05/23/24 0450 05/23/24 0844  BP: (!) 122/59 124/68  Pulse: (!) 59 67  Resp: 18 18  Temp:  98.6 F (37 C)  SpO2: 99% 97%   Arouses easily. Oriented x 3 CNI  Strength:5/5 throughout RUE 4+/5 LUE. 5/5 throughout BLE   Incision c/d/I with running suture in place. Palpable fluid collection beneath incision. No obvious redness or drainage   Data: 05/23/24 CT head   Other tests/results:  05/17/24 FINDINGS:   BRAIN AND VENTRICLES: Interval postoperative changes of right craniotomy for subdural hematoma evacuation with decreased residual subdural hematoma measuring 1.6 cm thickness, previously 1.9 cm. Decreased leftward midline shift measuring 1.0 cm, previously 1.2 cm. Possible small subarachnoid hemorrhage along right frontal convexity. Pneumocephalus along anterior frontal convexities. No evidence of acute infarct. No hydrocephalus.   ORBITS: No acute abnormality.   SINUSES: Mucosal thickening in right maxillary sinus.   SOFT TISSUES AND SKULL: Scalp drain and skin staples in place. Postoperative right craniotomy changes. Gas in right temporalis region soft tissues related to recent surgery. No skull fracture.   IMPRESSION: 1. Interval postoperative changes of right craniotomy for subdural hematoma evacuation with decreased residual subdural hematoma (1.6 cm, previously  1.9 cm) and decreased leftward midline shift (1.0 cm, previously 1.2 cm). Scalp drain and skin staples in place. 2. Possible small subarachnoid hemorrhage along right frontal convexity. 3. Pneumocephalus along anterior frontal convexities, likely postoperative.   Electronically signed by: Evalene Coho MD 05/17/2024 06:02 AM EST RP Workstation: [id number]  CT 05/18/24 Near resolution of hemorrhage from 1/30. MLS improved  2nd CT 1/31 - stable to slightly improved.  Labs reviewed - monitor H and H  Assessment/Plan:  Dominic Osborn is a 79 y.o s/p fall on ice resulting in right sided SDH with leftward shift. S/p craniotomy for evacuation and takeback for post-operative epidural hematoma. CT scan from 2/5 is reassuring   - Keppra  500 mg BID x 7 days (end 05/23/24) - DVT PPX ok to continue. Currently on Lovenox  40mg  Q24hr - Ok to restart Eliquis on 2/27. OK to resume aspirin 2/13 - PTOT, dispo planning underway. Therapy recommended CIR. Auth denied. Pt is stable for discharge from neurosurgical standpoint. Now pursuing a short term skilled nursing rehab.   Edsel Goods PA-C Department of Neurosurgery  "

## 2024-05-23 NOTE — Progress Notes (Signed)
 Physical Therapy Treatment Patient Details Name: Dominic Osborn MRN: [id number] DOB: 1946-01-28 Today's Date: 05/23/2024   History of Present Illness 79 y/o admitted 05/16/24 s/p fall where he hit his head, resulting in SDH. Imaging significant for Acute right cerebral convexity subdural hematoma measuring up to 1.8 cm with  associated mass effect, effacement of the right lateral ventricle, and right to left midline shift measuring 1.2 cm. S/p R craniotomy for evacuation of hematoma on 1/29 and 1/30 (due to worsening HA) by Dr. Clois. Significant PMH includes: HTN, hx AVR, and Afib (Eliquis).    PT Comments  Pt continues to be motivated to work with PT and generally did well.  He still has some delay for follow through on cues and had some difficulty donning socks at EOB (extra time, needing multiple re-set assists from PT). He was able to do prolonged venture into the hallway (~150 ft with walker and 150 w/o) as well as some stair negotiation and though he didn't have any overt LOBs there was consistent unsteadiness especially with L foot leading stair negotiation.  Pt also with some inconsistency during faded HHA balance challenges.  Pt making gains, continue with POC.     If plan is discharge home, recommend the following: A little help with bathing/dressing/bathroom;Assistance with cooking/housework;Direct supervision/assist for medications management;Direct supervision/assist for financial management;Assist for transportation;Help with stairs or ramp for entrance;Supervision due to cognitive status;A little help with walking and/or transfers   Can travel by private vehicle     No  Equipment Recommendations  Other (comment) (TBD)    Recommendations for Other Services Rehab consult     Precautions / Restrictions Precautions Precautions: Fall Recall of Precautions/Restrictions: Intact Restrictions Weight Bearing Restrictions Per Provider Order: No     Mobility  Bed Mobility Overal  bed mobility: Needs Assistance Bed Mobility: Supine to Sit     Supine to sit: Contact guard, Used rails     General bed mobility comments: Pt again with slow and segmented effort, but able to get up to sitting w/o direct assist    Transfers Overall transfer level: Needs assistance Equipment used: None Transfers: Sit to/from Stand Sit to Stand: Contact guard assist           General transfer comment: pt able to rise multiple times from standard height bed/recliner.  Heavy UE use but only CGA w/o need for physical assist with each effort    Ambulation/Gait Ambulation/Gait assistance: Contact guard assist Gait Distance (Feet): 300 Feet Assistive device: Rolling walker (2 wheels), None         General Gait Details: Pt feeling a little unsteady initially - requesting to use walker. He actually walks more consistently and with increased pace; final 150 ft without AD with focus on posture, step-length and consistency   Stairs Stairs: Yes Stairs assistance: Contact guard assist Stair Management: One rail Left, Forwards Number of Stairs: 8 General stair comments: Pt was able to negotiate w/o direct assist but showed some instability, especially with L foot placement and weight shift - heavy UE use   Wheelchair Mobility     Tilt Bed    Modified Rankin (Stroke Patients Only)       Balance Overall balance assessment: Needs assistance Sitting-balance support: Feet supported, No upper extremity supported Sitting balance-Leahy Scale: Good     Standing balance support: No upper extremity supported, During functional activity Standing balance-Leahy Scale: Fair (good static, fair dynamic) Standing balance comment: struggled with narrower BOS challenges, no LOBs with many  challenges                            Communication    Cognition Arousal: Alert Behavior During Therapy: WFL for tasks assessed/performed   PT - Cognitive impairments: Initiation,  Sequencing                       PT - Cognition Comments: delayed processing - daughter reports continued improvement Following commands: Intact Following commands impaired: Follows multi-step commands with increased time    Cueing Cueing Techniques: Verbal cues, Tactile cues  Exercises Other Exercises Other Exercises: faded HHA balance challenges: SLS 5-10 second holds b/l, tandem ambulation, retro ambulation Other Exercises: side step ups (stair well) 2 X 5 reps b/l with handrail use    General Comments        Pertinent Vitals/Pain Pain Assessment Pain Assessment: 0-10 Pain Score: 1  Pain Location: head ache/sore    Home Living                          Prior Function            PT Goals (current goals can now be found in the care plan section) Progress towards PT goals: Progressing toward goals    Frequency    Min 3X/week      PT Plan      Co-evaluation              AM-PAC PT 6 Clicks Mobility   Outcome Measure  Help needed turning from your back to your side while in a flat bed without using bedrails?: None Help needed moving from lying on your back to sitting on the side of a flat bed without using bedrails?: None Help needed moving to and from a bed to a chair (including a wheelchair)?: A Little Help needed standing up from a chair using your arms (e.g., wheelchair or bedside chair)?: A Little Help needed to walk in hospital room?: A Little Help needed climbing 3-5 steps with a railing? : A Little 6 Click Score: 20    End of Session Equipment Utilized During Treatment: Gait belt Activity Tolerance: Patient tolerated treatment well Patient left: with chair alarm set;with call bell/phone within reach;with family/visitor present Nurse Communication: Mobility status PT Visit Diagnosis: Unsteadiness on feet (R26.81);Muscle weakness (generalized) (M62.81);Difficulty in walking, not elsewhere classified (R26.2);Hemiplegia and  hemiparesis Hemiplegia - Right/Left: Left Hemiplegia - dominant/non-dominant: Non-dominant Hemiplegia - caused by: Unspecified     Time: 1350-1440 PT Time Calculation (min) (ACUTE ONLY): 50 min  Charges:    $Gait Training: 8-22 mins $Therapeutic Exercise: 8-22 mins $Therapeutic Activity: 8-22 mins PT General Charges $$ ACUTE PT VISIT: 1 Visit                     Carmin JONELLE Deed, DPT 05/23/2024, 4:00 PM

## 2024-05-23 NOTE — Progress Notes (Signed)
 Mobility Specialist - Progress Note    05/23/24 1737  Mobility  Activity Ambulated with assistance;Stood with assistance  Level of Assistance Contact guard assist, steadying assist  Assistive Device Front wheel walker  Distance Ambulated (ft) 200 ft  Range of Motion/Exercises Active  Activity Response Tolerated well  Mobility Referral Yes  Mobility visit 1 Mobility   Pt resting in bed on RA upon entry. Pt had difficulty performing daily tasking (opening bottle, impaired grasping in the bathroom). Pt ambulates to hallway around NS CGA with RW. Pt ambulates to stairwell and goes up and down 15 stairs with full gait belt hold to prevent fall. Pt returned to bed and left with needs in reach. Pt bed alarm activated.   Guido Rumble Mobility Specialist 05/23/24, 7:15 PM

## 2024-05-23 NOTE — Progress Notes (Signed)
 " PROGRESS NOTE    Dominic Osborn  FMW:[id number] DOB: July 20, 1945 DOA: 05/16/2024 PCP: Dominic Oneil FALCON, MD     Brief Narrative:   Dominic Osborn is a 79 year old male with a pmh of hypertension and atrial fibrillation on eliquis who presented to [facility] ED following a fall. It was reported that he fell between 3-4pm on 05/16/24. He was able to contact his daughter to pick him up and promptly brought him to the ED.    History gathered per chart review and evaluation at bedside It was reported that upon arrival to [facility] ED initially, he was able to ambulate independently and was conversing with family and hospital staff.    ED course: Upon arrival the patient reported a left-sided headache with wrist pain following his fall. He was taken for CT scan. While waiting for CT results, he mental status began to decline. Radiology contacted the EDP with concern for subdural hemorrhage. Per his daughter he is compliant with his medications and took eliquis the morning of his fall.  1/29: Admitted following a fall, found to have a subdural hematoma with midline shift requiring surgical intervention. PCCM consulted for admission. 1/30: Pt with c/o worsening headaches and neurologic condition worsened CT Head revealed subdural hematoma pt taken back to the OR for evacuation of hematoma  1/31: No acute events overnight vital signs stable.  PT/OT consulted  2/4: Insurance denied CIR, patient does not want to appeal, TOC is not working on SNF placement. 2/5: Remained hemodynamically stable.  Repeat CT head which was done with nursing concern of worsening headache, seems stable with improving midline shift and hematoma, postsurgical changes and small fluid collection which was anticipated after the surgery.  Neurosurgery added Fioricet  and should avoid opioids.  Anticipated headache for next few weeks with gradual improvement. Still pending placement.  Assessment & Plan:   Principal Problem:   Subdural hematoma  (HCC) Active Problems:   Essential (primary) hypertension   History of atrial fibrillation   S/P aortic valve replacement   Compression of brain (HCC)   Transient alteration of awareness   Epidural hematoma (HCC)  # Traumatic subdural hematoma # Epidural bleeding Secondary to accidental fall on ice.  Patient was on Eliquis which was reversed with Kcentra .   Evacuated by neurosurgery on 1/29, taken back to OR on 1/30 for epidural bleeding that was evacuated. Now stable - PT advising CIR, unfortunately insurance denied and now [facility] is working for SNF Repeat CT today with improving hematoma and poor surgical changes. - neurosurgery following - continue keppra  through 2/5 -Patient can restart Eliquis on 2/27 -Aspirin can be restarted on 2/13 - pain control, bowel regimen  # Acute blood loss anemia Hemoglobin at 9.1 today, combined EBL 500 ml - trend  # A-fib S/p cardioversion last year - neurosurgery advises holding anticoagulation 4 weeks, family says they are inclined to hold until they follow up with cardiology. Neurosurgery OK with antiplatelets beginning 2/13, if needed. OK for dvt ppx now. - cont home metop  # HTN Bp controlled - cont irbesartan  300, is on 80 of telmisartan at home - home hydrochlorothiazide on hold.  # HFpEF Euvolemic - cont home torsemide    DVT prophylaxis: lovenox  Code Status: full Family Communication: Discussed with wife at bedside  Level of care: Med-Surg Status is: Inpatient Remains inpatient appropriate because: CIF was denied by insurance, awaiting SNF placement   Consultants:  neurosurgery  Procedures: Craniotomy x 2  Antimicrobials:  Peri-operative  Subjective:  Patient was seen and examined today.  Continue to have some headache, no neurologic deficit.  Objective: Vitals:   05/22/24 1946 05/23/24 0450 05/23/24 0451 05/23/24 0844  BP: 131/75 (!) 122/59  124/68  Pulse: 68 (!) 59  67  Resp: 18 18  18   Temp: 98.9 F  (37.2 C)   98.6 F (37 C)  TempSrc:    Oral  SpO2: 100% 99%  97%  Weight:   101.5 kg   Height:        Intake/Output Summary (Last 24 hours) at 05/23/2024 1511 Last data filed at 05/23/2024 1423 Gross per 24 hour  Intake 480 ml  Output --  Net 480 ml   Filed Weights   05/21/24 0500 05/22/24 0351 05/23/24 0451  Weight: 101.1 kg 102 kg 101.5 kg    Examination:  General.  Frail elderly man, in no acute distress. Pulmonary.  Lungs clear bilaterally, normal respiratory effort. CV.  Regular rate and rhythm, no JVD, rub or murmur. Abdomen.  Soft, nontender, nondistended, BS positive. CNS.  Alert and oriented .  No focal neurologic deficit. Extremities.  No edema,  pulses intact and symmetrical. Psychiatry.  Judgment and insight appears normal.    Data Reviewed: I have personally reviewed following labs and imaging studies  CBC: Recent Labs  Lab 05/18/24 0356 05/18/24 1437 05/18/24 2023 05/19/24 0344 05/20/24 0519 05/23/24 0549  WBC 17.3* 19.2*  --  13.1* 8.4 8.5  HGB 8.5* 9.3* 8.8* 8.5* 8.6* 9.1*  HCT 25.8* 28.3* 26.5* 25.1* 25.5* 26.9*  MCV 90.8 91.3  --  89.0 88.9 88.5  PLT 137* 144*  --  119* 124* 185   Basic Metabolic Panel: Recent Labs  Lab 05/17/24 0315 05/18/24 0356 05/19/24 0344 05/20/24 0519 05/23/24 0549  NA 140 142 141 141 136  K 4.1 4.2 3.7 3.7 3.7  CL 104 106 107 109 102  CO2 24 27 28 25 25   GLUCOSE 180* 147* 125* 110* 104*  BUN 33* 38* 39* 24* 21  CREATININE 1.34* 1.30* 1.10 0.95 1.04  CALCIUM 9.0 8.5* 8.3* 8.4* 8.4*  MG 2.2 2.5* 2.4 2.3  --   PHOS 4.0 3.1 2.1* 2.3*  --    GFR: Estimated Creatinine Clearance: 73 mL/min (by C-G formula based on SCr of 1.04 mg/dL). Liver Function Tests: No results for input(s): AST, ALT, ALKPHOS, BILITOT, PROT, ALBUMIN in the last 168 hours. No results for input(s): LIPASE, AMYLASE in the last 168 hours. No results for input(s): AMMONIA in the last 168 hours. Coagulation Profile: Recent Labs   Lab 05/16/24 1810 05/17/24 0759  INR 1.3* 1.1   Cardiac Enzymes: No results for input(s): CKTOTAL, CKMB, CKMBINDEX, TROPONINI in the last 168 hours. BNP (last 3 results) No results for input(s): PROBNP in the last 8760 hours. HbA1C: No results for input(s): HGBA1C in the last 72 hours. CBG: Recent Labs  Lab 05/19/24 1233 05/19/24 1630 05/19/24 2133 05/20/24 0805 05/20/24 1147  GLUCAP 99 111* 112* 110* 109*   Lipid Profile: No results for input(s): CHOL, HDL, LDLCALC, TRIG, CHOLHDL, LDLDIRECT in the last 72 hours. Thyroid  Function Tests: No results for input(s): TSH, T4TOTAL, FREET4, T3FREE, THYROIDAB in the last 72 hours. Anemia Panel: No results for input(s): VITAMINB12, FOLATE, FERRITIN, TIBC, IRON, RETICCTPCT in the last 72 hours. Urine analysis:    Component Value Date/Time   APPEARANCEUR Clear 04/24/2020 1047   GLUCOSEU Negative 04/24/2020 1047   BILIRUBINUR Negative 04/24/2020 1047   PROTEINUR Negative 04/24/2020 1047  NITRITE Negative 04/24/2020 1047   LEUKOCYTESUR Negative 04/24/2020 1047   Sepsis Labs: @LABRCNTIP (procalcitonin:4,lacticidven:4)  ) Recent Results (from the past 240 hours)  MRSA Next Gen by PCR, Nasal     Status: None   Collection Time: 05/17/24  1:18 AM   Specimen: Nasal Mucosa; Nasal Swab  Result Value Ref Range Status   MRSA by PCR Next Gen NOT DETECTED NOT DETECTED Final    Comment: (NOTE) The GeneXpert MRSA Assay (FDA approved for NASAL specimens only), is one component of a comprehensive MRSA colonization surveillance program. It is not intended to diagnose MRSA infection nor to guide or monitor treatment for MRSA infections. Test performance is not FDA approved in patients less than 68 years old. Performed at [facility], [address] [place], [place] [address]     Radiology Studies: CT HEAD WO CONTRAST ( ) Result Date: 05/23/2024 EXAM: CT HEAD WITHOUT CONTRAST  05/23/2024 08:12:26 AM TECHNIQUE: CT of the head was performed without the administration of intravenous contrast. Automated exposure control, iterative reconstruction, and/or weight based adjustment of the mA/kV was utilized to reduce the radiation dose to as low as reasonably achievable. COMPARISON: Head CT 05/18/2024. CLINICAL HISTORY: 79 year old male. Status post right craniotomy for evacuation of hematoma last week. Subdural hematoma. FINDINGS: BRAIN AND VENTRICLES: Small volume scattered postoperative pneumocephalus has regressed but not resolved. Mixed density right side subdural hematoma. There is a superior right parietal convexity lobulated component measuring 12 mm in thickness (coronal image 60), stable. The remainder of the right lateral convexity subdural is now 8 to 9 mm in thickness (previously 10 mm). Consistent mass effect on the hemisphere. Leftward midline shift is now 6 mm and mildly regressed (previously 8 mm at the same level). Patency of the right lateral ventricle has mildly improved. Stable gray white differentiation. No new intracranial hemorrhage is identified. Hypodense possible nonhemorrhagic contusion of the right inferior frontal gyrus (series 7 image 14) is stable. No new cerebral edema. No evidence of acute infarct. No hydrocephalus. No extra-axial collection (other than the described subdural hematoma). ORBITS: No gaze deviation. SINUSES: The adjacent right maxillary sinus remains well aerated with no layering fluid or hemorrhage. Other paranasal sinuses, tympanic cavities and mastoids are well aerated. SOFT TISSUES AND SKULL: Postoperative broad based right scalp hematoma and soft tissue gas is stable. Stable right frontotemporal craniotomy. Right masticator space soft tissue gas, which is of unclear etiology and significance. No skull fracture. IMPRESSION: 1. Mixed-density right subdural hematoma has regressed by 1 to 2 mm (now generally 8-9 mm) with similar mildly regressed  mass effect on the right hemisphere, leftward midline shift (now 6 mm). 2. Stable possible non-hemorrhagic contusion, right inferior frontal gyrus. No new intracranial abnormality. 3. Scalp postoperative changes. Small volume of gas persists in the right masticator space of unclear etiology and significance. Electronically signed by: Helayne Hurst MD 05/23/2024 08:48 AM EST RP Workstation: [id number]    Scheduled Meds:  sodium chloride    Intravenous Once   acetaminophen   1,000 mg Oral Q6H   enoxaparin  (LOVENOX ) injection  40 mg Subcutaneous Q24H   irbesartan   300 mg Oral Daily   metoprolol  succinate  25 mg Oral QHS   metoprolol  succinate  50 mg Oral q morning   torsemide   5 mg Oral Q1200   Continuous Infusions:   LOS: 7 days   This record has been created using Conservation officer, historic buildings. Errors have been sought and corrected,but may not always be located. Such creation errors do not reflect  on the standard of care.   Amaryllis Dare, MD [facility]   If 7PM-7AM, please contact night-coverage [email or web address] Password [email or web address] 05/23/2024, 3:11 PM     "

## 2024-05-24 ENCOUNTER — Inpatient Hospital Stay

## 2024-05-24 MED ORDER — LIDOCAINE 5 % EX PTCH
1.0000 | MEDICATED_PATCH | CUTANEOUS | Status: AC
  Administered 2024-05-24: 1 via TRANSDERMAL
  Filled 2024-05-24: qty 1

## 2024-05-24 MED ORDER — OXYCODONE HCL 5 MG PO TABS
5.0000 mg | ORAL_TABLET | Freq: Four times a day (QID) | ORAL | Status: AC | PRN
  Filled 2024-05-24: qty 1

## 2024-05-24 NOTE — Progress Notes (Signed)
 Occupational Therapy Treatment Patient Details Name: Dominic Osborn MRN: [id number] DOB: [birth year] Today's Date: 05/24/2024   History of present illness 79 y/o admitted 05/16/24 s/p fall where he hit his head, resulting in SDH. Imaging significant for Acute right cerebral convexity subdural hematoma measuring up to 1.8 cm with  associated mass effect, effacement of the right lateral ventricle, and right to left midline shift measuring 1.2 cm. S/p R craniotomy for evacuation of hematoma on 1/29 and 1/30 (due to worsening HA) by Dr. Clois. Significant PMH includes: HTN, hx AVR, and Afib (Eliquis).   OT comments  Mr Nobbe was seen for OT treatment on this date. Upon arrival to room pt in bed with daughter at bed side, agreeable to tx. Pt requires significantly increased time to complete LB dressing seated EOB and cues to integrate LUE. CGA functional mobility for hallway navigation task no AD use, pt requires intermittent UE support on rails and MIN cues for directions, noted difficulty maintaining attention to navigation with dual tasking.   CGA functional reaching task picking item up from knee height cabinet and placing in above head height cabinet, minor LOB with picking item up from floor requiring UE support to correct. Pt complete LUE strength/coordination/vision assessment - noted deficits in vision scanning vertically and identifying numbers in L quadrants. Pt noted to bump into doorframes with L foot during navigation tasks. Remains fall risk due to poor insight into visual deficits. Significantly delayed gross motor control of nondominant LUE resulting in ~10 min to complete dressing task. Pt making good progress toward goals, will continue to follow POC. Discharge recommendation remains appropriate.        If plan is discharge home, recommend the following:  A little help with walking and/or transfers;A little help with bathing/dressing/bathroom;Assistance with cooking/housework;Assist for  transportation;Help with stairs or ramp for entrance   Equipment Recommendations  BSC/3in1    Recommendations for Other Services      Precautions / Restrictions Precautions Precautions: Fall Recall of Precautions/Restrictions: Intact Restrictions Weight Bearing Restrictions Per Provider Order: No       Mobility Bed Mobility Overal bed mobility: Needs Assistance Bed Mobility: Supine to Sit     Supine to sit: Contact guard, Used rails Sit to supine: Contact guard assist, Used rails        Transfers Overall transfer level: Needs assistance Equipment used: None Transfers: Sit to/from Stand Sit to Stand: Contact guard assist                 Balance Overall balance assessment: Needs assistance Sitting-balance support: Feet supported, No upper extremity supported Sitting balance-Leahy Scale: Good     Standing balance support: No upper extremity supported, During functional activity Standing balance-Leahy Scale: Fair                             ADL either performed or assessed with clinical judgement   ADL Overall ADL's : Needs assistance/impaired                                       General ADL Comments: Pt supervision don/doff shoes and underwear sitting EOB, increased time to complete. CGA + grab bar for simulated tub t/f. CGA functional reaching task picking item up from knee height cabinet and placing in above head height cabinet, minor LOB with picking item up from floor requiring  UE support to correct    Extremity/Trunk Assessment Upper Extremity Assessment Upper Extremity Assessment: Right hand dominant;LUE deficits/detail LUE Deficits / Details: 4+/5 shoudler flexion/extension, elbow flexion/extension, grip 4+/5. supination 4/5. impaired RAM and decreased speed for 5 digit opposition            Vision   Vision Assessment?: Yes Tracking/Visual Pursuits: Decreased smoothness of vertical tracking;Decreased smoothness of  eye movement to LEFT superior field;Decreased smoothness of eye movement to RIGHT superior field Saccades: Additional head turns occurred during testing;Additional eye shifts occurred during testing   Perception     Praxis     Communication Communication Communication: No apparent difficulties   Cognition Arousal: Alert Behavior During Therapy: WFL for tasks assessed/performed Cognition: No apparent impairments                               Following commands: Intact Following commands impaired: Follows multi-step commands with increased time      Cueing   Cueing Techniques: Verbal cues, Tactile cues  Exercises      Shoulder Instructions       General Comments      Pertinent Vitals/ Pain           Frequency  Min 3X/week        Progress Toward Goals  OT Goals(current goals can now be found in the care plan section)  Progress towards OT goals: Progressing toward goals  Acute Rehab OT Goals Patient Stated Goal: go home OT Goal Formulation: With patient/family Time For Goal Achievement: 06/07/24 Potential to Achieve Goals: Good  Plan      Co-evaluation                 AM-PAC OT 6 Clicks Daily Activity     Outcome Measure   Help from another person eating meals?: A Little Help from another person taking care of personal grooming?: A Little Help from another person toileting, which includes using toliet, bedpan, or urinal?: A Little Help from another person bathing (including washing, rinsing, drying)?: A Lot Help from another person to put on and taking off regular upper body clothing?: A Little Help from another person to put on and taking off regular lower body clothing?: A Little 6 Click Score: 17    End of Session Equipment Utilized During Treatment: Gait belt;Rolling walker (2 wheels)  OT Visit Diagnosis: Unsteadiness on feet (R26.81);Other abnormalities of gait and mobility (R26.89);Muscle weakness (generalized) (M62.81)    Activity Tolerance Patient tolerated treatment well   Patient Left in bed;with call bell/phone within reach;with family/visitor present   Nurse Communication          Time: 8548-8448 OT Time Calculation (min): 60 min  Charges: OT General Charges $OT Visit: 1 Visit OT Treatments $Self Care/Home Management : 23-37 mins $Therapeutic Activity: 23-37 mins  Elston Slot, M.S. OTR/L  05/24/24, 3:59 PM  ascom [phone]

## 2024-05-24 NOTE — TOC Progression Note (Signed)
 Transition of Care [facility]) - Progression Note    Patient Details  Name: Dominic Osborn MRN: [id number] Date of Birth: [birth year]  [facility] CM/SW Contact  Dominic CHRISTELLA Ring, Dominic Osborn Phone Number: 05/24/2024, 5:13 PM  Clinical Narrative:     Received a message from Dominic Osborn that Dominic Osborn was offering a Peer to Peer review.  Dominic Osborn completed the peer to peer and says that they are going to approve.  Messaged [facility] to see if they will accept tomorrow?  He really wants a private room.                      Expected Discharge Plan and Services         Expected Discharge Date: 05/24/24                                     Social Drivers of Health (SDOH) Interventions SDOH Screenings   Food Insecurity: No Food Insecurity (05/17/2024)  Housing: Low Risk (05/17/2024)  Transportation Needs: No Transportation Needs (05/17/2024)  Utilities: Not At Risk (05/17/2024)  Financial Resource Strain: Low Risk  (09/14/2023)   Received from [facility]  Social Connections: Socially Integrated (05/23/2024)  Tobacco Use: Low Risk (05/16/2024)    Readmission Risk Interventions     No data to display

## 2024-05-24 NOTE — Progress Notes (Signed)
 " PROGRESS NOTE    Dominic Osborn  FMW:[id number] DOB: 1945-05-09 DOA: 05/16/2024 PCP: Cleotilde Oneil FALCON, MD     Brief Narrative:   Dominic Osborn. Dominic Osborn is a 79 year old male with a pmh of hypertension and atrial fibrillation on eliquis who presented to [facility] ED following a fall. It was reported that he fell between 3-4pm on 05/16/24. He was able to contact his daughter to pick him up and promptly brought him to the ED.    History gathered per chart review and evaluation at bedside It was reported that upon arrival to [facility] ED initially, he was able to ambulate independently and was conversing with family and hospital staff.    ED course: Upon arrival the patient reported a left-sided headache with wrist pain following his fall. He was taken for CT scan. While waiting for CT results, he mental status began to decline. Radiology contacted the EDP with concern for subdural hemorrhage. Per his daughter he is compliant with his medications and took eliquis the morning of his fall.  1/29: Admitted following a fall, found to have a subdural hematoma with midline shift requiring surgical intervention. PCCM consulted for admission. 1/30: Pt with c/o worsening headaches and neurologic condition worsened CT Head revealed subdural hematoma pt taken back to the OR for evacuation of hematoma  1/31: No acute events overnight vital signs stable.  PT/OT consulted  2/4: Insurance denied CIR, patient does not want to appeal, TOC is not working on SNF placement. 2/5: Remained hemodynamically stable.  Repeat CT head which was done with nursing concern of worsening headache, seems stable with improving midline shift and hematoma, postsurgical changes and small fluid collection which was anticipated after the surgery.  Neurosurgery added Fioricet  and should avoid opioids.  Anticipated headache for next few weeks with gradual improvement. Still pending placement. 2/6: Remained hemodynamically stable, improving headache, still  pending insurance authorization.  Assessment & Plan:   Principal Problem:   Subdural hematoma (HCC) Active Problems:   Essential (primary) hypertension   History of atrial fibrillation   S/P aortic valve replacement   Compression of brain (HCC)   Transient alteration of awareness   Epidural hematoma (HCC)  # Traumatic subdural hematoma # Epidural bleeding Secondary to accidental fall on ice.  Patient was on Eliquis which was reversed with Kcentra .   Evacuated by neurosurgery on 1/29, taken back to OR on 1/30 for epidural bleeding that was evacuated. Now stable - PT advising CIR, unfortunately insurance denied and now [facility] is working for SNF Repeat CT today with improving hematoma and poor surgical changes. - neurosurgery following - Completed Keppra  on 2/5 -Patient can restart Eliquis on 2/27 -Aspirin can be restarted on 2/13 - pain control, bowel regimen  # Acute blood loss anemia Hemoglobin at 9.1 , combined EBL 500 ml - trend  # A-fib S/p cardioversion last year - neurosurgery advises holding anticoagulation 4 weeks, family says they are inclined to hold until they follow up with cardiology. Neurosurgery OK with antiplatelets beginning 2/13, if needed. OK for dvt ppx now. - cont home metop  # HTN Bp controlled - cont irbesartan  300, is on 80 of telmisartan at home - home hydrochlorothiazide on hold.  # HFpEF Euvolemic - cont home torsemide    DVT prophylaxis: lovenox  Code Status: full Family Communication: Discussed with wife at bedside  Level of care: Med-Surg Status is: Inpatient Remains inpatient appropriate because: CIF was denied by insurance, awaiting SNF placement   Consultants:  neurosurgery  Procedures: Craniotomy x 2  Antimicrobials:  Peri-operative    Subjective:  Patient was seen and examined today.  Headache seems improving.  No new concern.  Per patient he is ready to get out of the hospital.  Objective: Vitals:   05/23/24 1738  05/23/24 1954 05/24/24 0415 05/24/24 0753  BP: (!) 143/64 127/70 118/66 135/73  Pulse: 71 67 69 65  Resp: 17 18 18 16   Temp: 98.2 F (36.8 C) 98.7 F (37.1 C) (!) 97.5 F (36.4 C) 98.3 F (36.8 C)  TempSrc:   Oral   SpO2: 100% 100% 98% 100%  Weight:   100.6 kg   Height:        Intake/Output Summary (Last 24 hours) at 05/24/2024 1522 Last data filed at 05/24/2024 1300 Gross per 24 hour  Intake 840 ml  Output --  Net 840 ml   Filed Weights   05/22/24 0351 05/23/24 0451 05/24/24 0415  Weight: 102 kg 101.5 kg 100.6 kg    Examination:  General.  Well-developed elderly man, in no acute distress. Pulmonary.  Lungs clear bilaterally, normal respiratory effort. CV.  Regular rate and rhythm, no JVD, rub or murmur. Abdomen.  Soft, nontender, nondistended, BS positive. CNS.  Alert and oriented .  No focal neurologic deficit. Extremities.  No edema,  pulses intact and symmetrical. Psychiatry.  Judgment and insight appears normal.    Data Reviewed: I have personally reviewed following labs and imaging studies  CBC: Recent Labs  Lab 05/18/24 0356 05/18/24 1437 05/18/24 2023 05/19/24 0344 05/20/24 0519 05/23/24 0549  WBC 17.3* 19.2*  --  13.1* 8.4 8.5  HGB 8.5* 9.3* 8.8* 8.5* 8.6* 9.1*  HCT 25.8* 28.3* 26.5* 25.1* 25.5* 26.9*  MCV 90.8 91.3  --  89.0 88.9 88.5  PLT 137* 144*  --  119* 124* 185   Basic Metabolic Panel: Recent Labs  Lab 05/18/24 0356 05/19/24 0344 05/20/24 0519 05/23/24 0549  NA 142 141 141 136  K 4.2 3.7 3.7 3.7  CL 106 107 109 102  CO2 27 28 25 25   GLUCOSE 147* 125* 110* 104*  BUN 38* 39* 24* 21  CREATININE 1.30* 1.10 0.95 1.04  CALCIUM 8.5* 8.3* 8.4* 8.4*  MG 2.5* 2.4 2.3  --   PHOS 3.1 2.1* 2.3*  --    GFR: Estimated Creatinine Clearance: 73 mL/min (by C-G formula based on SCr of 1.04 mg/dL). Liver Function Tests: No results for input(s): AST, ALT, ALKPHOS, BILITOT, PROT, ALBUMIN in the last 168 hours. No results for input(s):  LIPASE, AMYLASE in the last 168 hours. No results for input(s): AMMONIA in the last 168 hours. Coagulation Profile: No results for input(s): INR, PROTIME in the last 168 hours.  Cardiac Enzymes: No results for input(s): CKTOTAL, CKMB, CKMBINDEX, TROPONINI in the last 168 hours. BNP (last 3 results) No results for input(s): PROBNP in the last 8760 hours. HbA1C: No results for input(s): HGBA1C in the last 72 hours. CBG: Recent Labs  Lab 05/19/24 1233 05/19/24 1630 05/19/24 2133 05/20/24 0805 05/20/24 1147  GLUCAP 99 111* 112* 110* 109*   Lipid Profile: No results for input(s): CHOL, HDL, LDLCALC, TRIG, CHOLHDL, LDLDIRECT in the last 72 hours. Thyroid  Function Tests: No results for input(s): TSH, T4TOTAL, FREET4, T3FREE, THYROIDAB in the last 72 hours. Anemia Panel: No results for input(s): VITAMINB12, FOLATE, FERRITIN, TIBC, IRON, RETICCTPCT in the last 72 hours. Urine analysis:    Component Value Date/Time   APPEARANCEUR Clear 04/24/2020 1047   GLUCOSEU Negative 04/24/2020 1047  BILIRUBINUR Negative 04/24/2020 1047   PROTEINUR Negative 04/24/2020 1047   NITRITE Negative 04/24/2020 1047   LEUKOCYTESUR Negative 04/24/2020 1047   Sepsis Labs: @LABRCNTIP (procalcitonin:4,lacticidven:4)  ) Recent Results (from the past 240 hours)  MRSA Next Gen by PCR, Nasal     Status: None   Collection Time: 05/17/24  1:18 AM   Specimen: Nasal Mucosa; Nasal Swab  Result Value Ref Range Status   MRSA by PCR Next Gen NOT DETECTED NOT DETECTED Final    Comment: (NOTE) The GeneXpert MRSA Assay (FDA approved for NASAL specimens only), is one component of a comprehensive MRSA colonization surveillance program. It is not intended to diagnose MRSA infection nor to guide or monitor treatment for MRSA infections. Test performance is not FDA approved in patients less than 96 years old. Performed at [facility], [address], [place], [place] [address]     Radiology Studies: CT HEAD WO CONTRAST ( ) Result Date: 05/23/2024 EXAM: CT HEAD WITHOUT CONTRAST 05/23/2024 08:12:26 AM TECHNIQUE: CT of the head was performed without the administration of intravenous contrast. Automated exposure control, iterative reconstruction, and/or weight based adjustment of the mA/kV was utilized to reduce the radiation dose to as low as reasonably achievable. COMPARISON: Head CT 05/18/2024. CLINICAL HISTORY: 79 year old male. Status post right craniotomy for evacuation of hematoma last week. Subdural hematoma. FINDINGS: BRAIN AND VENTRICLES: Small volume scattered postoperative pneumocephalus has regressed but not resolved. Mixed density right side subdural hematoma. There is a superior right parietal convexity lobulated component measuring 12 mm in thickness (coronal image 60), stable. The remainder of the right lateral convexity subdural is now 8 to 9 mm in thickness (previously 10 mm). Consistent mass effect on the hemisphere. Leftward midline shift is now 6 mm and mildly regressed (previously 8 mm at the same level). Patency of the right lateral ventricle has mildly improved. Stable gray white differentiation. No new intracranial hemorrhage is identified. Hypodense possible nonhemorrhagic contusion of the right inferior frontal gyrus (series 7 image 14) is stable. No new cerebral edema. No evidence of acute infarct. No hydrocephalus. No extra-axial collection (other than the described subdural hematoma). ORBITS: No gaze deviation. SINUSES: The adjacent right maxillary sinus remains well aerated with no layering fluid or hemorrhage. Other paranasal sinuses, tympanic cavities and mastoids are well aerated. SOFT TISSUES AND SKULL: Postoperative broad based right scalp hematoma and soft tissue gas is stable. Stable right frontotemporal craniotomy. Right masticator space soft tissue gas, which is of unclear etiology and significance. No skull fracture.  IMPRESSION: 1. Mixed-density right subdural hematoma has regressed by 1 to 2 mm (now generally 8-9 mm) with similar mildly regressed mass effect on the right hemisphere, leftward midline shift (now 6 mm). 2. Stable possible non-hemorrhagic contusion, right inferior frontal gyrus. No new intracranial abnormality. 3. Scalp postoperative changes. Small volume of gas persists in the right masticator space of unclear etiology and significance. Electronically signed by: Helayne Hurst MD 05/23/2024 08:48 AM EST RP Workstation: [id number]    Scheduled Meds:  sodium chloride    Intravenous Once   acetaminophen   1,000 mg Oral Q6H   enoxaparin  (LOVENOX ) injection  40 mg Subcutaneous Q24H   irbesartan   300 mg Oral Daily   metoprolol  succinate  25 mg Oral QHS   metoprolol  succinate  50 mg Oral q morning   torsemide   5 mg Oral Q1200   Continuous Infusions:   LOS: 8 days   This record has been created using Conservation officer, historic buildings. Errors have been sought and  corrected,but may not always be located. Such creation errors do not reflect on the standard of care.   Amaryllis Dare, MD [facility]   If 7PM-7AM, please contact night-coverage [email or web address] Password [email or web address] 05/24/2024, 3:22 PM     "

## 2024-05-24 NOTE — Progress Notes (Signed)
 Physical Therapy Treatment Patient Details Name: Dominic Osborn MRN: [id number] DOB: 1945/05/25 Today's Date: 05/24/2024   History of Present Illness 79 y/o admitted 05/16/24 s/p fall where he hit his head, resulting in SDH. Imaging significant for Acute right cerebral convexity subdural hematoma measuring up to 1.8 cm with  associated mass effect, effacement of the right lateral ventricle, and right to left midline shift measuring 1.2 cm. S/p R craniotomy for evacuation of hematoma on 1/29 and 1/30 (due to worsening HA) by Dr. Clois. Significant PMH includes: HTN, hx AVR, and Afib (Eliquis).    PT Comments  Patient continues to progress with gross functional mobility, but continues to display dynamic balance deficits.  BERG score 26/56, indicative of high fall risk with mobility tasks (particulary with those requiring altered, narrowed BOS or SLS).  Patient often requiring UE support for external stabilization/balance recovery during BERG and functional mobility/ADL activities.  Does endorse mild soreness to R hip this date (FACES 2/10); improved with rest and repositioning.  Participated with ambulatory scavenger hunt throughout unit, cga/close sup for gait efforts; min cuing to problem-solve and sequence activities, min cuing for STM/recall, min/mod cuing for visual attention/awareness of L visual field.  Continue to require constant supervision/assist to optimize safety with all functional activities.   If plan is discharge home, recommend the following: A little help with bathing/dressing/bathroom;Assistance with cooking/housework;Direct supervision/assist for medications management;Direct supervision/assist for financial management;Assist for transportation;Help with stairs or ramp for entrance;Supervision due to cognitive status;A little help with walking and/or transfers   Can travel by private vehicle        Equipment Recommendations       Recommendations for Other Services        Precautions / Restrictions Precautions Precautions: Fall Recall of Precautions/Restrictions: Intact Restrictions Weight Bearing Restrictions Per Provider Order: No     Mobility  Bed Mobility Overal bed mobility: Needs Assistance Bed Mobility: Supine to Sit     Supine to sit: Min assist     General bed mobility comments: transition towards R side of bed; min assist to facilitate truncal elevation/anterior weight translation    Transfers Overall transfer level: Needs assistance Equipment used: None Transfers: Sit to/from Stand Sit to Stand: Contact guard assist           General transfer comment: unable to complete without bilat UE support    Ambulation/Gait Ambulation/Gait assistance: Contact guard assist Gait Distance (Feet): 150 Feet Assistive device: None         General Gait Details: reciprocal stepping pattern, mildly antalgic due to R hip pain and mild L-sided weakness; min cuing for visual attention/awareness to L visual field.  Slow and deliberate, but no overt buckling or LOB.  Mild difficulty with dual-task engagement, often ceasing gait to participate with conversation or cognitive distraction   Stairs             Wheelchair Mobility     Tilt Bed    Modified Rankin (Stroke Patients Only)       Balance Overall balance assessment: Needs assistance Sitting-balance support: Feet supported, No upper extremity supported Sitting balance-Leahy Scale: Good     Standing balance support: No upper extremity supported Standing balance-Leahy Scale: Fair                   Standardized Balance Assessment Standardized Balance Assessment : Berg Balance Test Berg Balance Test Sit to Stand: Able to stand using hands after several tries Standing Unsupported: Able to stand 2 minutes with  supervision Sitting with Back Unsupported but Feet Supported on Floor or Stool: Able to sit safely and securely 2 minutes Stand to Sit: Controls descent by  using hands Transfers: Able to transfer with verbal cueing and /or supervision Standing Unsupported with Eyes Closed: Able to stand 10 seconds with supervision Standing Ubsupported with Feet Together: Needs help to attain position but able to stand for 30 seconds with feet together From Standing, Reach Forward with Outstretched Arm: Reaches forward but needs supervision From Standing Position, Pick up Object from Floor: Unable to pick up and needs supervision (limited by R hip pain with closed-chain hip flexion) From Standing Position, Turn to Look Behind Over each Shoulder: Turn sideways only but maintains balance Turn 360 Degrees: Needs close supervision or verbal cueing Standing Unsupported, Alternately Place Feet on Step/Stool: Able to complete >2 steps/needs minimal assist Standing Unsupported, One Foot in Front: Needs help to step but can hold 15 seconds Standing on One Leg: Tries to lift leg/unable to hold 3 seconds but remains standing independently Total Score: 26        Communication Communication Communication: No apparent difficulties  Cognition Arousal: Alert Behavior During Therapy: WFL for tasks assessed/performed   PT - Cognitive impairments: Initiation, Sequencing, Problem solving, Memory                         Following commands: Intact Following commands impaired: Follows multi-step commands with increased time    Cueing Cueing Techniques: Verbal cues, Tactile cues  Exercises Other Exercises Other Exercises: Ambulatory scavenger hunt throughout unit, cga/close sup for gait efforts; min cuing to problem-solve and sequence activities, min cuing for STM/recall, min/mod cuing for visual attention/awareness of L visual field Other Exercises: Standing balance to empty bladder, close sup; increased time/effort to coordinate and sequence task.  Limited functional reach outside immediate BOS; self-initiating UE support (on grab bar) for external support as needed.   Min/mod assist for L UE position/placement when exiting bathroom (left hand on/around door handle when rounding door)    General Comments        Pertinent Vitals/Pain Pain Assessment Pain Assessment: Faces Faces Pain Scale: Hurts a little bit Pain Location: R hpi Pain Descriptors / Indicators: Aching, Sore Pain Intervention(s): Limited activity within patient's tolerance, Monitored during session, Repositioned    Home Living                          Prior Function            PT Goals (current goals can now be found in the care plan section) Acute Rehab PT Goals Patient Stated Goal: get better PT Goal Formulation: With patient/family Time For Goal Achievement: 06/01/24 Potential to Achieve Goals: Good Progress towards PT goals: Progressing toward goals    Frequency    Min 3X/week      PT Plan      Co-evaluation              AM-PAC PT 6 Clicks Mobility   Outcome Measure  Help needed turning from your back to your side while in a flat bed without using bedrails?: None Help needed moving from lying on your back to sitting on the side of a flat bed without using bedrails?: None Help needed moving to and from a bed to a chair (including a wheelchair)?: A Little Help needed standing up from a chair using your arms (e.g., wheelchair or bedside chair)?: A  Little Help needed to walk in hospital room?: A Little Help needed climbing 3-5 steps with a railing? : A Little 6 Click Score: 20    End of Session   Activity Tolerance: Patient tolerated treatment well Patient left: in bed;with bed alarm set;with family/visitor present;with call bell/phone within reach Nurse Communication: Mobility status PT Visit Diagnosis: Unsteadiness on feet (R26.81);Muscle weakness (generalized) (M62.81);Difficulty in walking, not elsewhere classified (R26.2);Hemiplegia and hemiparesis Hemiplegia - Right/Left: Left Hemiplegia - dominant/non-dominant:  Non-dominant Hemiplegia - caused by: Unspecified     Time: 1610-1650 PT Time Calculation (min) (ACUTE ONLY): 40 min  Charges:    $Gait Training: 8-22 mins $Therapeutic Activity: 8-22 mins $Neuromuscular Re-education: 8-22 mins PT General Charges $$ ACUTE PT VISIT: 1 Visit                     Eileene Kisling H. Delores, PT, DPT, NCS 05/24/24, 10:38 PM [phone]

## 2024-05-24 NOTE — Plan of Care (Signed)
" °  Problem: Education: Goal: Knowledge of General Education information will improve Description: Including pain rating scale, medication(s)/side effects and non-pharmacologic comfort measures Outcome: Progressing   Problem: Health Behavior/Discharge Planning: Goal: Ability to manage health-related needs will improve Outcome: Progressing   Problem: Clinical Measurements: Goal: Ability to maintain clinical measurements within normal limits will improve Outcome: Progressing Goal: Will remain free from infection Outcome: Progressing Goal: Diagnostic test results will improve Outcome: Progressing Goal: Respiratory complications will improve Outcome: Progressing Goal: Cardiovascular complication will be avoided Outcome: Progressing   Problem: Activity: Goal: Risk for activity intolerance will decrease Outcome: Progressing   Problem: Nutrition: Goal: Adequate nutrition will be maintained Outcome: Progressing   Problem: Coping: Goal: Level of anxiety will decrease Outcome: Progressing   Problem: Elimination: Goal: Will not experience complications related to bowel motility Outcome: Progressing Goal: Will not experience complications related to urinary retention Outcome: Progressing   Problem: Pain Managment: Goal: General experience of comfort will improve and/or be controlled Outcome: Progressing   Problem: Safety: Goal: Ability to remain free from injury will improve Outcome: Progressing   Problem: Skin Integrity: Goal: Risk for impaired skin integrity will decrease Outcome: Progressing   Problem: Education: Goal: Knowledge of the prescribed therapeutic regimen will improve Outcome: Progressing   Problem: Bowel/Gastric: Goal: Gastrointestinal status for postoperative course will improve Outcome: Progressing   Problem: Cardiac: Goal: Ability to maintain an adequate cardiac output Outcome: Progressing Goal: Will show no evidence of cardiac arrhythmias Outcome:  Progressing   Problem: Nutritional: Goal: Will attain and maintain optimal nutritional status Outcome: Progressing   Problem: Neurological: Goal: Will regain or maintain usual level of consciousness Outcome: Progressing   Problem: Clinical Measurements: Goal: Ability to maintain clinical measurements within normal limits Outcome: Progressing Goal: Postoperative complications will be avoided or minimized Outcome: Progressing   Problem: Respiratory: Goal: Will regain and/or maintain adequate ventilation Outcome: Progressing Goal: Respiratory status will improve Outcome: Progressing   Problem: Skin Integrity: Goal: Demonstrates signs of wound healing without infection Outcome: Progressing   Problem: Urinary Elimination: Goal: Will remain free from infection Outcome: Progressing Goal: Ability to achieve and maintain adequate urine output Outcome: Progressing   Problem: Education: Goal: Ability to describe self-care measures that may prevent or decrease complications (Diabetes Survival Skills Education) will improve Outcome: Progressing   Problem: Coping: Goal: Ability to adjust to condition or change in health will improve Outcome: Progressing   Problem: Fluid Volume: Goal: Ability to maintain a balanced intake and output will improve Outcome: Progressing   Problem: Health Behavior/Discharge Planning: Goal: Ability to identify and utilize available resources and services will improve Outcome: Progressing Goal: Ability to manage health-related needs will improve Outcome: Progressing   Problem: Metabolic: Goal: Ability to maintain appropriate glucose levels will improve Outcome: Progressing   Problem: Nutritional: Goal: Maintenance of adequate nutrition will improve Outcome: Progressing Goal: Progress toward achieving an optimal weight will improve Outcome: Progressing   Problem: Skin Integrity: Goal: Risk for impaired skin integrity will decrease Outcome:  Progressing   Problem: Tissue Perfusion: Goal: Adequacy of tissue perfusion will improve Outcome: Progressing   "

## 2024-05-24 NOTE — TOC Progression Note (Signed)
 Transition of Care [facility]) - Progression Note    Patient Details  Name: Dominic Osborn MRN: [id number] Date of Birth: [birth year]  Transition of Care [facility] CM/SW Contact  Nathanael CHRISTELLA Ring, RN Phone Number: 05/24/2024, 11:41 AM  Clinical Narrative:     Insurance authorization pending.                     Expected Discharge Plan and Services                                               Social Drivers of Health (SDOH) Interventions SDOH Screenings   Food Insecurity: No Food Insecurity (05/17/2024)  Housing: Low Risk (05/17/2024)  Transportation Needs: No Transportation Needs (05/17/2024)  Utilities: Not At Risk (05/17/2024)  Financial Resource Strain: Low Risk  (09/14/2023)   Received from [facility]  Social Connections: Socially Integrated (05/23/2024)  Tobacco Use: Low Risk (05/16/2024)    Readmission Risk Interventions     No data to display

## 2024-05-30 ENCOUNTER — Encounter: Admitting: Neurosurgery

## 2024-06-25 ENCOUNTER — Encounter: Admitting: Neurosurgery

## 2024-08-08 ENCOUNTER — Encounter: Admitting: Neurosurgery

## 2025-03-05 ENCOUNTER — Other Ambulatory Visit

## 2025-03-07 ENCOUNTER — Ambulatory Visit: Admitting: Urology
